# Patient Record
Sex: Male | Born: 1970 | Race: White | Hispanic: No | Marital: Married | Smoking: Never smoker
Health system: Southern US, Community
[De-identification: ages and names within clinical notes are randomized; demographics above are authoritative.]

## PROBLEM LIST (undated history)

## (undated) DIAGNOSIS — K219 Gastro-esophageal reflux disease without esophagitis: Secondary | ICD-10-CM

## (undated) DIAGNOSIS — C801 Malignant (primary) neoplasm, unspecified: Secondary | ICD-10-CM

## (undated) HISTORY — DX: Malignant (primary) neoplasm, unspecified: C80.1

## (undated) HISTORY — DX: Gastro-esophageal reflux disease without esophagitis: K21.9

---
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
## 1988-06-18 HISTORY — PX: OTHER SURGICAL HISTORY: SHX169

## 1988-06-18 HISTORY — PX: ANTERIOR CRUCIATE LIGAMENT REPAIR: SHX115

## 1998-07-21 ENCOUNTER — Ambulatory Visit (HOSPITAL_COMMUNITY): Admission: RE | Admit: 1998-07-21 | Discharge: 1998-07-21 | Payer: Self-pay | Admitting: Family Medicine

## 2000-01-30 ENCOUNTER — Encounter: Payer: Self-pay | Admitting: Orthopedic Surgery

## 2000-01-30 ENCOUNTER — Ambulatory Visit: Admission: RE | Admit: 2000-01-30 | Discharge: 2000-01-30 | Payer: Self-pay | Admitting: Orthopedic Surgery

## 2004-05-16 ENCOUNTER — Ambulatory Visit: Payer: Self-pay | Admitting: Internal Medicine

## 2005-07-09 ENCOUNTER — Ambulatory Visit: Payer: Self-pay | Admitting: Internal Medicine

## 2006-05-28 ENCOUNTER — Ambulatory Visit: Payer: Self-pay | Admitting: Internal Medicine

## 2007-02-24 DIAGNOSIS — K219 Gastro-esophageal reflux disease without esophagitis: Secondary | ICD-10-CM | POA: Insufficient documentation

## 2008-12-10 ENCOUNTER — Ambulatory Visit: Payer: Self-pay | Admitting: Internal Medicine

## 2008-12-13 ENCOUNTER — Telehealth: Payer: Self-pay | Admitting: Internal Medicine

## 2009-03-11 ENCOUNTER — Ambulatory Visit: Payer: Self-pay | Admitting: Internal Medicine

## 2009-03-11 DIAGNOSIS — R109 Unspecified abdominal pain: Secondary | ICD-10-CM | POA: Insufficient documentation

## 2009-03-18 ENCOUNTER — Ambulatory Visit: Payer: Self-pay | Admitting: Sports Medicine

## 2009-03-18 DIAGNOSIS — M7750 Other enthesopathy of unspecified foot: Secondary | ICD-10-CM | POA: Insufficient documentation

## 2009-03-18 DIAGNOSIS — R109 Unspecified abdominal pain: Secondary | ICD-10-CM | POA: Insufficient documentation

## 2009-03-18 DIAGNOSIS — R5383 Other fatigue: Secondary | ICD-10-CM

## 2009-03-18 DIAGNOSIS — R269 Unspecified abnormalities of gait and mobility: Secondary | ICD-10-CM | POA: Insufficient documentation

## 2009-03-18 DIAGNOSIS — R5381 Other malaise: Secondary | ICD-10-CM | POA: Insufficient documentation

## 2009-04-11 ENCOUNTER — Ambulatory Visit: Payer: Self-pay | Admitting: Sports Medicine

## 2009-04-11 DIAGNOSIS — M2140 Flat foot [pes planus] (acquired), unspecified foot: Secondary | ICD-10-CM | POA: Insufficient documentation

## 2009-06-03 ENCOUNTER — Encounter: Payer: Self-pay | Admitting: Internal Medicine

## 2009-11-02 ENCOUNTER — Telehealth: Payer: Self-pay | Admitting: Internal Medicine

## 2010-05-19 ENCOUNTER — Ambulatory Visit: Payer: Self-pay | Admitting: Internal Medicine

## 2010-05-19 DIAGNOSIS — J069 Acute upper respiratory infection, unspecified: Secondary | ICD-10-CM | POA: Insufficient documentation

## 2010-07-16 LAB — CONVERTED CEMR LAB
ALT: 34 units/L (ref 0–53)
AST: 28 units/L (ref 0–37)
Albumin: 4.4 g/dL (ref 3.5–5.2)
Alkaline Phosphatase: 63 units/L (ref 39–117)
BUN: 17 mg/dL (ref 6–23)
Basophils Absolute: 0 10*3/uL (ref 0.0–0.1)
Basophils Relative: 0.2 % (ref 0.0–3.0)
Bilirubin, Direct: 0.2 mg/dL (ref 0.0–0.3)
CO2: 32 meq/L (ref 19–32)
Calcium: 9.5 mg/dL (ref 8.4–10.5)
Chloride: 104 meq/L (ref 96–112)
Cholesterol: 182 mg/dL (ref 0–200)
Creatinine, Ser: 1 mg/dL (ref 0.4–1.5)
Eosinophils Absolute: 0.1 10*3/uL (ref 0.0–0.7)
Eosinophils Relative: 2.3 % (ref 0.0–5.0)
GFR calc non Af Amer: 88.03 mL/min (ref >60)
Glucose, Bld: 108 mg/dL — ABNORMAL HIGH (ref 70–99)
HCT: 46 % (ref 39.0–52.0)
HDL: 43.8 mg/dL (ref >39.00)
Hemoglobin: 16 g/dL (ref 13.0–17.0)
LDL Cholesterol: 119 mg/dL — ABNORMAL HIGH (ref 0–99)
Lymphocytes Relative: 39.5 % (ref 12.0–46.0)
Lymphs Abs: 2.2 10*3/uL (ref 0.7–4.0)
MCHC: 34.7 g/dL (ref 30.0–36.0)
MCV: 89.6 fL (ref 78.0–100.0)
Monocytes Absolute: 0.7 10*3/uL (ref 0.1–1.0)
Monocytes Relative: 12 % (ref 3.0–12.0)
Neutro Abs: 2.6 10*3/uL (ref 1.4–7.7)
Neutrophils Relative %: 46 % (ref 43.0–77.0)
PSA: 0.4 ng/mL (ref 0.10–4.00)
Platelets: 211 10*3/uL (ref 150.0–400.0)
Potassium: 4.2 meq/L (ref 3.5–5.1)
RBC: 5.14 M/uL (ref 4.22–5.81)
RDW: 12 % (ref 11.5–14.6)
Sodium: 142 meq/L (ref 135–145)
TSH: 1.36 microintl units/mL (ref 0.35–5.50)
Total Bilirubin: 1.3 mg/dL — ABNORMAL HIGH (ref 0.3–1.2)
Total CHOL/HDL Ratio: 4
Total Protein: 7.4 g/dL (ref 6.0–8.3)
Triglycerides: 97 mg/dL (ref 0.0–149.0)
VLDL: 19.4 mg/dL (ref 0.0–40.0)
WBC: 5.6 10*3/uL (ref 4.5–10.5)

## 2010-07-20 NOTE — Assessment & Plan Note (Signed)
Summary: NP FOOT PAIN/HIP PAIN/MJD   Vital Signs:  Patient profile:   40 year old male Height:      72 inches Weight:      228 pounds BP sitting:   132 / 74  Vitals Entered By: Lillia Pauls CMA (March 18, 2009 10:04 AM)   History of Present Illness: Patient presents with the concerns for mid-groin pain and left foot pain between the 4th and 5th toes..  1987 had a shattered right patella fixed surgically. Doing well. Then had a left ACL repair in the 1990s.  Has had difficulty with a area of fungal infection/abscess between his 4th and 5th toes on the left since 1989. He is now drying it very well and no long has concerns about this except that he started having some pain near his 4th toe recently. Has seen a podiatrist back in 2000 who thought that he was developing a hammer toe which may need surgery in the future.  Main concern is groin/pubic pain. He has had it intermittently for th past 2 years. Was typically in the right but now has had it in the left since this summer. Usually goes through a 5 day flare in which he did some form of exercise activitiy prior to the onset and then has it flare badly for 2 days and then gradually decrease. Says that the pain is mainly between his scrotum and anus. Has not tried meds or icing. 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  Hurt his back in April while lifting furniture and saw a Chiropodist who worked on his core muscle strength. He did not see a difference with his pubic pain after working on his core muscles. Denies pain with coughing or sneezing. Has been checked by his PCP for hernias which he does not have.   Allergies: No Known Drug Allergies  Physical Exam  General:  alert, well-developed, well-nourished, and well-hydrated.   Head:  normocephalic and atraumatic.   Msk:  RIGHT HIP: Normal inspection. Decreased internal rotation to about 20 degrees. Normal  external rotation. Pain with resisted hip abduction. Weakness with hip abduction against resistance. No pain with resisted hip adduction. Weak hip flexors. Negative pelvic tilt testing.  Negative FABER testing. Normal hamstring strength.   LEFT HIP: Normal inspection. Decreased internal rotation to about 25 degrees. Normal external rotation. Pain again with resisted hip abduction. Weakness of hip abductor and hip flexors. Good hip adductor strength. Negative pelvic tilt and FABER testing. Normal hamstring strength.   Equal leg lengths. No pain over pubic bones.   Pes planus bilaterally with navicular prominance. Pain on plantar surface of left 4th MT with palpation. While jogging: Significant intoeing of the left foot and pronation of the right foot. (Video recorded for records).   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 Will work on correcting his gait to decrease the tension on his pubic joint with line drills and arch strengthing drills. Given a pair of sports insoles to help correct the pronation of his right foot in addition to a metatarsal pad to decrease the pressure on his left 4th MT. Hip exercises. Will consider permanent orthotics in 2-3 weeks.   Problem # 2:  ABNORMALITY OF GAIT (ICD-781.2) Assessment: New  Significant intoeing of left foot and pronation of  right foot while jogging.  Given temporary orthotics to correct pes planus bilaterally. Given a MT pad for metatarsalgia associated with left 4th MT. Lines drills in which he is to put his entire left foot on a line and touch the line with his right big toe to correct his gait.  Will need custom orthotics in the future.   Orders: Sports Insoles [phone])  Problem # 3:  METATARSALGIA (ICD-726.70) Assessment: New  Given a metatarsal pad for his left shoe which had to  be ground down a bit for comfort. Patient liked it before he left the office.   Orders: Sports Insoles [phone])  Problem # 4:  WEAKNESS (ICD-780.79) Assessment: New Hip abductor weakness bilaterally. Given hip strengthening handout with exercises to do daily.   Complete Medication List: 1)  Propecia 1 Mg Tabs (Finasteride) .Marland Kitchen.. 1 once daily as needed  Patient Instructions: 1)  Do line drills - do 50 yards, 10 times. You want your left foot straight along the line and touch your big toe with your right foot to work on your gait. 2)  Do the hip exercises daily. 3)  If you have stairs, walk up and down them 10 times on your toes to work on your arch. 4)  We would like to see you back to make a pair of custom orthotics for you in 2-3 weeks.  5)  Your running gait is what is likely putting tension on your pubic bones causing some shearing forces. 6)  Your orthotics should help with the toe pain. We will try a metatarsal pad under the 4th toe to prevent it from hitting the bottom of your shoe.

## 2010-07-20 NOTE — Assessment & Plan Note (Signed)
Summary: ? groin pain//ccm   Vital Signs:  Patient profile:   40 year old male Weight:      233 pounds BMI:     31.71 Temp:     98.5 degrees F oral BP sitting:   128 / 80  (left arm) Cuff size:   regular  Vitals Entered By: Raechel Ache, RN (March 11, 2009 9:42 AM) CC: C/o bilateral groin pain. Is Patient Diabetic? No   CC:  C/o bilateral groin pain.Marland Kitchen  History of Present Illness: 40 year old patient, who presents with bilateral groin and lower abdominal pain.  This actually has been present intermittently for several months.  He often goes months between episodes.  Pain is maximal left groin area and occasionally involves the right and lower abdominal regions.  Pain is aggravated by movement.  When it is the most severe and resolves usually over 5 to 6 days.  He denies any known aggravating factors.  He was concerned about a possible hernia. he has a history of GERD, which has been stable  Allergies: No Known Drug Allergies  Past History:  Past Medical History: Reviewed history from 02/24/2007 and no changes required. GERD  Physical Exam  General:  overweight-appearing.   Abdomen:  Bowel sounds positive,abdomen soft and non-tender without masses, organomegaly or hernias noted. Genitalia:  Testes bilaterally descended without nodularity, tenderness or masses. No scrotal masses or lesions. No penis lesions or urethral discharge.   Impression & Recommendations:  Problem # 1:  ABDOMINAL PAIN (ICD-789.00) no evidence of hernia.  This appears to be musculoligamentous.  Will treat with ibuprofen during acute flares and try a regimen of stretching and range of motion on a more regular basis.  Will cough are improved  Problem # 2:  GERD (ICD-530.81)  Complete Medication List: 1)  Propecia 1 Mg Tabs (Finasteride) .Marland Kitchen.. 1 once daily as needed  Patient Instructions: 1)  Avoid foods high in acid (tomatoes, citrus juices, spicy foods). Avoid eating within two hours of lying  down or before exercising. Do not over eat; try smaller more frequent meals. Elevate head of bed twelve inches when sleeping. 2)  Take 400-600mg  of Ibuprofen (Advil, Motrin) with food every 4-6 hours as needed for relief of pain or comfort of fever. 3)  consider a regimen of gentle stretching with hip flexion and extension Prescriptions: PROPECIA 1 MG TABS (FINASTERIDE) 1 once daily as needed  #90 x 4   Entered and Authorized by:   Gordy Savers  MD   Signed by:   Gordy Savers  MD on 03/11/2009   Method used:   Print then Give to Patient   RxID:   [id number]

## 2010-07-20 NOTE — Assessment & Plan Note (Signed)
Summary: COUGH, CONGESTION - OK PER DR K // RS   Vital Signs:  Kyle [name] profile:   40 year old male Weight:      247 pounds Temp:     98.1 degrees F oral BP sitting:   110 / 80  (left arm) Cuff size:   regular  Vitals Entered By: Duard Brady LPN (May 19, 2010 12:44 PM) CC: chest congestion, cough Is Kyle [name] Diabetic? No   CC:  chest congestion and cough.  History of Present Illness: 40 year old Kyle [name], who presents with a several day history of chest congestion, head congestion and cough.  There's been no fever.  He has had some intermittent symptoms for 4 to 5 weeks.  He is exposed to a number of small children with acute illness.  He has gastroesophageal  reflux disease, which has been stable.  No real fever, chest pain, shortness or breath or purulent sputum production, and  Allergies (verified): No Known Drug Allergies  Past History:  Past Medical History: Reviewed history from 02/24/2007 and no changes required. GERD  Review of Systems       The Kyle [name] complains of hoarseness and prolonged cough.  The Kyle [name] denies anorexia, fever, weight loss, weight gain, vision loss, decreased hearing, chest pain, syncope, dyspnea on exertion, peripheral edema, headaches, hemoptysis, abdominal pain, melena, hematochezia, severe indigestion/heartburn, hematuria, incontinence, genital sores, muscle weakness, suspicious skin lesions, transient blindness, difficulty walking, depression, unusual weight change, abnormal bleeding, enlarged lymph nodes, angioedema, breast masses, and testicular masses.    Physical Exam  General:  overweight-appearing. normal blood pressure, afebrile. Head:  Normocephalic and atraumatic without obvious abnormalities. No apparent alopecia or balding. Eyes:  No corneal or conjunctival inflammation noted. EOMI. Perrla. Funduscopic exam benign, without hemorrhages, exudates or papilledema. Vision grossly normal. Ears:  External ear exam shows no  significant lesions or deformities.  Otoscopic examination reveals clear canals, tympanic membranes are intact bilaterally without bulging, retraction, inflammation or discharge. Hearing is grossly normal bilaterally. Nose:  External nasal examination shows no deformity or inflammation. Nasal mucosa are pink and moist without lesions or exudates. Mouth:  Oral mucosa and oropharynx without lesions or exudates.  Teeth in good repair. Neck:  No deformities, masses, or tenderness noted. Lungs:  Normal respiratory effort, chest expands symmetrically. Lungs are clear to auscultation, no crackles or wheezes. Heart:  Normal rate and regular rhythm. S1 and S2 normal without gallop, murmur, click, rub or other extra sounds. Abdomen:  Bowel sounds positive,abdomen soft and non-tender without masses, organomegaly or hernias noted.   Impression & Recommendations:  Problem # 1:  URI (ICD-465.9)  His updated medication list for this problem includes:    Hydrocodone-homatropine 5-1.5 Mg/38ml Syrp (Hydrocodone-homatropine) ..... One tsp every 6 hours for cough or congestion a  Complete Medication List: 1)  Propecia 1 Mg Tabs (Finasteride) .Marland Kitchen.. 1 once daily as needed 2)  Hydrocodone-homatropine 5-1.5 Mg/77ml Syrp (Hydrocodone-homatropine) .... One tsp every 6 hours for cough or congestion a  Kyle [name] Instructions: 1)  Get plenty of rest, drink lots of clear liquids, and use Tylenol or Ibuprofen for fever and comfort. Return in 7-10 days if you're not better:sooner if you're feeling worse. Prescriptions: HYDROCODONE-HOMATROPINE 5-1.5 MG/5ML SYRP (HYDROCODONE-HOMATROPINE) one tsp every 6 hours for cough or congestion a  #6 oz x 0   Entered and Authorized by:   Gordy Savers  MD   Signed by:   Gordy Savers  MD on 05/19/2010   Method used:   Print then Give  to Kyle [name]   RxID:   [id number] PROPECIA 1 MG TABS (FINASTERIDE) 1 once daily as needed  #90 x 4   Entered and Authorized by:   Gordy Savers  MD   Signed by:   Gordy Savers  MD on 05/19/2010   Method used:   Electronically to        [facility] [address] [phone]* (retail)       [address]       [address]       [place], [place]  [address]       Ph: [phone]       Fax: [phone]   RxID:   [id number]    Orders Added: 1)  Est. Kyle [name] Level III [56387]

## 2010-07-20 NOTE — Assessment & Plan Note (Signed)
Summary: cpx/njr rsc with pt from [facility] will come fasting/mhf   Vital Signs:  Patient profile:   40 year old male Height:      72 inches Weight:      237 pounds BMI:     32.26 Pulse rate:   68 / minute Pulse rhythm:   regular BP sitting:   114 / 70  (left arm) Cuff size:   regular  Vitals Entered By: Raechel Ache, RN (December 10, 2008 9:18 AM)  CC:  CPX and fasting. Wants PSA.Marland Kitchen  History of Present Illness: 40 year old patient who is seen today for a wellness exam  Allergies (verified): No Known Drug Allergies  Past History:  Past Medical History: Reviewed history from 02/24/2007 and no changes required. GERD  Past Surgical History: surgery for shattered right patella 1986 left ACL 1990  Family History: Reviewed history from 02/24/2007 and no changes required. Family History of Alcoholism/Addiction Family History of Arthritis Family History Diabetes 1st degree relative Family History High cholesterol Family History Hypertension Family History of Stroke F 1st degree relative <60 Family History of Sudden Death Family History of Cardiovascular disorder  father with coronary artery disease, status post CABG, status post carotid endarterectomy, status post ablation for atrial fibrillation; history of type 2 diabetes mother history of hypothyroidism  Social History: Reviewed history from 02/24/2007 and no changes required. Occupation:  Never Smoked Alcohol use-yes Drug use-no Regular exercise-no 3 children Married  Review of Systems  The patient denies anorexia, fever, weight loss, weight gain, vision loss, decreased hearing, hoarseness, chest pain, syncope, dyspnea on exertion, peripheral edema, prolonged cough, headaches, hemoptysis, abdominal pain, melena, hematochezia, severe indigestion/heartburn, hematuria, incontinence, genital sores, muscle weakness, suspicious skin lesions, transient blindness, difficulty walking, depression, unusual weight change,  abnormal bleeding, enlarged lymph nodes, angioedema, breast masses, and testicular masses.    Physical Exam  General:  overweight-appearing.  120/80 Head:  Normocephalic and atraumatic without obvious abnormalities. No apparent alopecia or balding. Eyes:  No corneal or conjunctival inflammation noted. EOMI. Perrla. Funduscopic exam benign, without hemorrhages, exudates or papilledema. Vision grossly normal. Ears:  External ear exam shows no significant lesions or deformities.  Otoscopic examination reveals clear canals, tympanic membranes are intact bilaterally without bulging, retraction, inflammation or discharge. Hearing is grossly normal bilaterally. Nose:  External nasal examination shows no deformity or inflammation. Nasal mucosa are pink and moist without lesions or exudates. Mouth:  Oral mucosa and oropharynx without lesions or exudates.  Teeth in good repair. Neck:  No deformities, masses, or tenderness noted. Chest Wall:  No deformities, masses, tenderness or gynecomastia noted. Breasts:  No masses or gynecomastia noted Lungs:  Normal respiratory effort, chest expands symmetrically. Lungs are clear to auscultation, no crackles or wheezes. Heart:  Normal rate and regular rhythm. S1 and S2 normal without gallop, murmur, click, rub or other extra sounds. Abdomen:  Bowel sounds positive,abdomen soft and non-tender without masses, organomegaly or hernias noted. Rectal:  No external abnormalities noted. Normal sphincter tone. No rectal masses or tenderness. Genitalia:  Testes bilaterally descended without nodularity, tenderness or masses. No scrotal masses or lesions. No penis lesions or urethral discharge. Prostate:  Prostate gland firm and smooth, no enlargement, nodularity, tenderness, mass, asymmetry or induration. Msk:  No deformity or scoliosis noted of thoracic or lumbar spine.   Pulses:  R and L carotid,radial,femoral,dorsalis pedis and posterior tibial pulses are full and equal  bilaterally Extremities:  No clubbing, cyanosis, edema, or deformity noted with normal full range of motion of all  joints.   Neurologic:  No cranial nerve deficits noted. Station and gait are normal. Plantar reflexes are down-going bilaterally. DTRs are symmetrical throughout. Sensory, motor and coordinative functions appear intact. Skin:  Intact without suspicious lesions or rashes Cervical Nodes:  No lymphadenopathy noted Axillary Nodes:  No palpable lymphadenopathy Inguinal Nodes:  No significant adenopathy Psych:  Cognition and judgment appear intact. Alert and cooperative with normal attention span and concentration. No apparent delusions, illusions, hallucinations   Impression & Recommendations:  Problem # 1:  Preventive Health Care (ICD-V70.0)  Orders: Venipuncture (14782) TLB-Lipid Panel (80061-LIPID) TLB-BMP (Basic Metabolic Panel-BMET) (80048-METABOL) TLB-Hepatic/Liver Function Pnl (80076-HEPATIC) TLB-CBC Platelet - w/Differential (85025-CBCD) TLB-TSH (Thyroid Stimulating Hormone) (84443-TSH) TLB-PSA (Prostate Specific Antigen) (84153-PSA)  Complete Medication List: 1)  Propecia 1 Mg Tabs (Finasteride) .Marland Kitchen.. 1 once daily as needed  Patient Instructions: 1)  Limit your Sodium (Salt). 2)  It is important that you exercise regularly at least 20 minutes 5 times a week. If you develop chest pain, have severe difficulty breathing, or feel very tired , stop exercising immediately and seek medical attention. 3)  You need to lose weight. Consider a lower calorie diet and regular exercise.  Prescriptions: PROPECIA 1 MG TABS (FINASTERIDE) 1 once daily as needed  #90 x 4   Entered and Authorized by:   Gordy Savers  MD   Signed by:   Gordy Savers  MD on 12/10/2008   Method used:   Print then Give to Patient   RxID:   [id number]   Appended Document: cpx/njr rsc with pt from [facility] will come fasting/mhf    Clinical Lists Changes  Orders: Added new Service  order of EKG w/ Interpretation (93000) - Signed      Appended Document: cpx/njr rsc with pt from [facility] will come fasting/mhf  Laboratory Results   Urine Tests    Routine Urinalysis   Color: yellow Appearance: Clear Glucose: negative   (Normal Range: Negative) Bilirubin: negative   (Normal Range: Negative) Ketone: negative   (Normal Range: Negative) Spec. Gravity: 1.010   (Normal Range: 1.003-1.035) Blood: negative   (Normal Range: Negative) pH: 6.0   (Normal Range: 5.0-8.0) Protein: negative   (Normal Range: Negative) Urobilinogen: 0.2   (Normal Range: 0-1) Nitrite: negative   (Normal Range: Negative) Leukocyte Esterace: negative   (Normal Range: Negative)    Comments: Joanne Chars CMA  December 10, 2008 4:35 PM

## 2010-07-20 NOTE — Medication Information (Signed)
Summary: [facility]  [facility]   Imported By: Maryln Gottron 06/14/2009 11:41:06  _____________________________________________________________________  External Attachment:    Type:   Image     Comment:   External Document

## 2010-07-20 NOTE — Progress Notes (Signed)
  Phone Note Outgoing Call   Call placed by: Raechel Ache, RN,  December 13, 2008 9:20 AM Call placed to: Patient Summary of Call: [facility] re labs  Follow-up for Phone Call        report given. Follow-up by: Raechel Ache, RN,  December 13, 2008 10:15 AM

## 2010-07-20 NOTE — Assessment & Plan Note (Signed)
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  Seen 1 mo ago and placed in temp orthotics as well as left MT pad.  Notes some improvement in groin and left foot pain - previously having sharp pain along 4th MT, and shooting pain along longitudinal arch when active.  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  Not using any med for pain.  Would like custom orthotics today.  note he had a significant gait abnormality on last visit and this seemed to contribute   Allergies: No Known Drug Allergies  Physical Exam  General:  alert, well-developed, well-nourished, and well-hydrated.   Msk:  Feet: RIGHT: excessive pronation from pes planus.  navicular prominence.  Pain along 4th MT dorsal with palpation.  LEFT: excessive pronation from pes planus.  significant intoeing of foot when walking/jogging. mild TTP at left MT heads primarily 4th  Upon standing loses longitudinal and transverse arches bilaterally.  Equal leg lengths. Extremities:  running gait shows high foot strke ext Rot of RT foot IR of left foot more pronation on rt  after orthotics gait is improved but still with major changes  as above Neurologic:  neurovascularly intact   Impression & Recommendations:  Problem # 1:  ABNORMALITY OF GAIT (ICD-781.2)  fitted with custom orthotics that will hopefully fit in soccer cleats.  try out for comfort, RTC if not fitting (did not bring cleats today).  Also provided with insoles for other shoes for better arch support bilaterally.  Orders: [employer], each unit [phone])  Patient was fitted for a standard, cushioned, semi-rigid orthotic.  The orthotic was heated and the patient stood on the orthotic blank positioned on the  orthotic stand. The patient was positioned in subtalar neutral position and 10 degrees of ankle dorsiflexion in a weight bearing stance. After completion of molding a stable based was applied to the orthotic blank.   The blank was ground to a stable position for weight bearing. size 13 black stripe fast tek base black F4 styrofoam posting  MT pad left additional orthotic padding  time 42 mins  Problem # 2:  METATARSALGIA (ICD-726.70) Assessment: Improved  left MT pad on custom orthotics and soft insoles.  Orders: [employer], each unit (L3002)  Ck back if not responding in 8 wks  Problem # 3:  PES PLANUS (ICD-734)  Orders: [employer], each unit [phone])  given sports insoles to wear into other shoes use arch support as much as possible  Complete Medication List: 1)  Propecia 1 Mg Tabs (Finasteride) .Marland Kitchen.. 1 once daily as needed

## 2010-07-20 NOTE — Progress Notes (Signed)
Summary: finasteride refill  Phone Note Call from Patient Call back at Home Phone [phone]   Summary of Call: Refill Finasteride.  Says the dermatologist gave Rx 5mg  1/4 tab po daily 30 day supply, but he prefers the 1mg  tab daily and needs your Rx to [facility] Initial call taken by: Rudy Jew, RN,  Nov 02, 2009 12:56 PM  Follow-up for Phone Call        OK 1 mg  #90 one daily RF 6 Follow-up by: Gordy Savers  MD,  Nov 03, 2009 8:11 AM    Prescriptions: PROPECIA 1 MG TABS (FINASTERIDE) 1 once daily as needed  #90 x 4   Entered by:   Duard Brady LPN   Authorized by:   Gordy Savers  MD   Signed by:   Duard Brady LPN on 09/81/1914   Method used:   Faxed to ...       [facility] [address] [phone]* (retail)       [address]       [address]       [place], [place]  [address]       Ph: [phone]       Fax: [phone]   RxID:   [id number]  faxed to [facility]    kik

## 2011-04-16 ENCOUNTER — Ambulatory Visit (INDEPENDENT_AMBULATORY_CARE_PROVIDER_SITE_OTHER): Payer: BC Managed Care – PPO | Admitting: Family Medicine

## 2011-04-16 ENCOUNTER — Encounter: Payer: Self-pay | Admitting: Family Medicine

## 2011-04-16 VITALS — BP 126/80 | HR 84 | Temp 99.0°F | Wt 238.0 lb

## 2011-04-16 DIAGNOSIS — J329 Chronic sinusitis, unspecified: Secondary | ICD-10-CM

## 2011-04-16 MED ORDER — AMOXICILLIN-POT CLAVULANATE 875-125 MG PO TABS: 1.0000 | ORAL_TABLET | Freq: Two times a day (BID) | ORAL | Status: AC

## 2011-04-16 NOTE — Progress Notes (Signed)
  Subjective:    Patient ID: OMEED OSUNA, male    DOB: 1971/04/29, 40 y.o.   MRN: [id number]  HPI Here for 6 days of sinus pressure, PND, ST, and a dry cough. Low grade fevers.    Review of Systems  Constitutional: Positive for fever.  HENT: Positive for congestion, postnasal drip and sinus pressure.   Eyes: Negative.   Respiratory: Positive for cough.        Objective:   Physical Exam  Constitutional: He appears well-developed and well-nourished.  HENT:  Right Ear: External ear normal.  Left Ear: External ear normal.  Nose: Nose normal.  Mouth/Throat: Oropharynx is clear and moist. No oropharyngeal exudate.  Eyes: Conjunctivae are normal. Pupils are equal, round, and reactive to light.  Neck: No thyromegaly present.  Pulmonary/Chest: Effort normal and breath sounds normal. No respiratory distress. He has no wheezes. He has no rales. He exhibits no tenderness.  Lymphadenopathy:    He has no cervical adenopathy.          Assessment & Plan:  Add Mucinex

## 2013-06-18 DIAGNOSIS — C099 Malignant neoplasm of tonsil, unspecified: Secondary | ICD-10-CM

## 2013-06-18 HISTORY — DX: Malignant neoplasm of tonsil, unspecified: C09.9

## 2013-09-29 ENCOUNTER — Ambulatory Visit (INDEPENDENT_AMBULATORY_CARE_PROVIDER_SITE_OTHER): Payer: BC Managed Care – PPO | Admitting: Family Medicine

## 2013-09-29 ENCOUNTER — Encounter: Payer: Self-pay | Admitting: Family Medicine

## 2013-09-29 VITALS — BP 110/74 | HR 84 | Temp 99.7°F | Wt 246.0 lb

## 2013-09-29 DIAGNOSIS — J069 Acute upper respiratory infection, unspecified: Secondary | ICD-10-CM

## 2013-09-29 NOTE — Progress Notes (Signed)
Pre visit review using our clinic review tool, if applicable. No additional management support is needed unless otherwise documented below in the visit note. 

## 2013-09-29 NOTE — Progress Notes (Signed)
Chief Complaint  Patient presents with  . Cough    congestion, fever    HPI:  -started: about  4 days ago -symptoms:nasal congestion, low grade subjective fever, cough -denies:fever, SOB, NVD, tooth pain -has tried: nothing -sick contacts/travel/risks: denies flu exposure, tick exposure or or Ebola risks - daughter with similar symptoms then she impoved  ROS: See pertinent positives and negatives per HPI.  Past Medical History  Diagnosis Date  . GERD (gastroesophageal reflux disease)     Past Surgical History  Procedure Laterality Date  . Surgery for shattered right patella  1986  . Left acl  1990    Family History  Problem Relation Age of Onset  . Hypothyroidism Mother   . Coronary artery disease Father     status post CABG, carotid endarterectomy, ablation for atrial fib  . Diabetes Father   . Atrial fibrillation Father   . Alcohol abuse Other   . Arthritis Other   . Diabetes Other   . Hyperlipidemia Other   . Hypertension Other   . Stroke Other   . Sudden death Other   . Heart disease Other     History   Social History  . Marital Status: Married    Spouse Name: N/A    Number of Children: N/A  . Years of Education: N/A   Social History Main Topics  . Smoking status: Never Smoker   . Smokeless tobacco: Never Used  . Alcohol Use: 1.5 oz/week    3 drink(s) per week  . Drug Use: No  . Sexual Activity: None   Other Topics Concern  . None   Social History Narrative  . None    Current outpatient prescriptions:finasteride (PROPECIA) 1 MG tablet, Take 1 mg by mouth daily.  , Disp: , Rfl: ;  HYDROcodone-homatropine (HYCODAN) 5-1.5 MG/5ML syrup, Take by mouth every 6 (six) hours as needed.  , Disp: , Rfl:   EXAM:  Filed Vitals:   09/29/13 1449  BP: 110/74  Pulse: 84  Temp: 99.7 F (37.6 C)    Body mass index is 33.36 kg/(m^2).  GENERAL: vitals reviewed and listed above, alert, oriented, appears well hydrated and in no acute distress  HEENT:  atraumatic, conjunttiva clear, no obvious abnormalities on inspection of external nose and ears, normal appearance of ear canals and TMs, clear nasal congestion, mild post oropharyngeal erythema with PND, no tonsillar edema or exudate, no sinus TTP  NECK: no obvious masses on inspection  LUNGS: clear to auscultation bilaterally, no wheezes, rales or rhonchi, good air movement  CV: HRRR, no peripheral edema  MS: moves all extremities without noticeable abnormality  PSYCH: pleasant and cooperative, no obvious depression or anxiety  ASSESSMENT AND PLAN:  Discussed the following assessment and plan:  Upper respiratory infection  -given HPI and exam findings today, a serious infection or illness is unlikely. We discussed potential etiologies, with VURI being most likely, and advised supportive care and monitoring. We discussed treatment side effects, likely course, antibiotic misuse, transmission, and signs of developing a serious illness. -of course, we advised to return or notify a doctor immediately if symptoms worsen or persist or new concerns arise.    Patient Instructions  INSTRUCTIONS FOR UPPER RESPIRATORY INFECTION:  -plenty of rest and fluids  -nasal saline wash 2-3 times daily (use prepackaged nasal saline or bottled/distilled water if making your own)   -clean nose with nasal saline before using the nasal steroid or sinex  -can use AFRIN nasal spray for drainage and  nasal congestion - but do NOT use longer then 3-4 days  -can use tylenol or ibuprofen as directed for aches and sorethroat  -in the winter time, using a humidifier at night is helpful (please follow cleaning instructions)  -if you are taking a cough medication - use only as directed, may also try a teaspoon of honey to coat the throat and throat lozenges  -for sore throat, salt water gargles can help  -follow up if you have fevers, facial pain, tooth pain, difficulty breathing or are worsening or not  getting better in 5-7 days      Lucretia Kern

## 2013-09-29 NOTE — Patient Instructions (Signed)
INSTRUCTIONS FOR UPPER RESPIRATORY INFECTION:  -plenty of rest and fluids  -nasal saline wash 2-3 times daily (use prepackaged nasal saline or bottled/distilled water if making your own)   -clean nose with nasal saline before using the nasal steroid or sinex  -can use AFRIN nasal spray for drainage and nasal congestion - but do NOT use longer then 3-4 days  -can use tylenol or ibuprofen as directed for aches and sorethroat  -in the winter time, using a humidifier at night is helpful (please follow cleaning instructions)  -if you are taking a cough medication - use only as directed, may also try a teaspoon of honey to coat the throat and throat lozenges  -for sore throat, salt water gargles can help  -follow up if you have fevers, facial pain, tooth pain, difficulty breathing or are worsening or not getting better in 5-7 days  

## 2013-12-07 ENCOUNTER — Encounter: Payer: Self-pay | Admitting: Internal Medicine

## 2013-12-07 ENCOUNTER — Ambulatory Visit (INDEPENDENT_AMBULATORY_CARE_PROVIDER_SITE_OTHER): Payer: BC Managed Care – PPO | Admitting: Internal Medicine

## 2013-12-07 ENCOUNTER — Ambulatory Visit: Payer: BC Managed Care – PPO | Admitting: Family

## 2013-12-07 VITALS — BP 120/66 | HR 85 | Temp 99.4°F | Resp 20 | Ht 72.0 in | Wt 240.0 lb

## 2013-12-07 DIAGNOSIS — J209 Acute bronchitis, unspecified: Secondary | ICD-10-CM

## 2013-12-07 DIAGNOSIS — J069 Acute upper respiratory infection, unspecified: Secondary | ICD-10-CM

## 2013-12-07 MED ORDER — HYDROCODONE-HOMATROPINE 5-1.5 MG/5ML PO SYRP: 5.0000 mL | ORAL_SOLUTION | Freq: Four times a day (QID) | ORAL | Status: DC | PRN

## 2013-12-07 MED ORDER — AZITHROMYCIN 250 MG PO TABS: ORAL_TABLET | ORAL | Status: DC

## 2013-12-07 NOTE — Progress Notes (Signed)
Pre-visit discussion using our clinic review tool. No additional management support is needed unless otherwise documented below in the visit note.  

## 2013-12-07 NOTE — Patient Instructions (Signed)
Take over-the-counter expectorants and cough medications such as  Mucinex DM.  Call if there is no improvement in 5 to 7 days or if  you develop worsening cough, fever, or new symptoms, such as shortness of breath or chest pain.  Take your antibiotic as prescribed until ALL of it is gone, but stop if you develop a rash, swelling, or any side effects of the medication.  Contact our office as soon as possible if  there are side effects of the medication. 

## 2013-12-07 NOTE — Progress Notes (Signed)
Subjective:    Patient ID: Kyle [name], male    DOB: 10/16/1970, 43 y.o.   MRN: [id number]  HPI  43 year old patient, who presents with a 2 and a half-to three-month history of cough.  He was seen here about 9 weeks ago and treated symptomatically for a viral URI.  Cough has persisted, but over the past week, and a half.  He has clinically worsened with low-grade fever, malaise, and now with purulent sputum production.  He describes a considerable amount of green sputum.  He expectorates in the morning.  Denies any wheezing, shortness of breath, or chest pain.  Past Medical History  Diagnosis Date  . GERD (gastroesophageal reflux disease)     History   Social History  . Marital Status: Married    Spouse Name: N/A    Number of Children: N/A  . Years of Education: N/A   Occupational History  . Not on file.   Social History Main Topics  . Smoking status: Never Smoker   . Smokeless tobacco: Never Used  . Alcohol Use: 1.5 oz/week    3 drink(s) per week  . Drug Use: No  . Sexual Activity: Not on file   Other Topics Concern  . Not on file   Social History Narrative  . No narrative on file    Past Surgical History  Procedure Laterality Date  . Surgery for shattered right patella  1986  . Left acl  1990    Family History  Problem Relation Age of Onset  . Hypothyroidism Mother   . Coronary artery disease Father     status post CABG, carotid endarterectomy, ablation for atrial fib  . Diabetes Father   . Atrial fibrillation Father   . Alcohol abuse Other   . Arthritis Other   . Diabetes Other   . Hyperlipidemia Other   . Hypertension Other   . Stroke Other   . Sudden death Other   . Heart disease Other     No Known Allergies  Current Outpatient Prescriptions on File Prior to Visit  Medication Sig Dispense Refill  . HYDROcodone-homatropine (HYCODAN) 5-1.5 MG/5ML syrup Take by mouth every 6 (six) hours as needed.         No current facility-administered  medications on file prior to visit.    BP 120/66  Pulse 85  Temp(Src) 99.4 F (37.4 C) (Oral)  Resp 20  Ht 6' (1.829 m)  Wt 240 lb (108.863 kg)  BMI 32.54 kg/m2  SpO2 96%       Review of Systems  Constitutional: Positive for fever and fatigue. Negative for chills and appetite change.  HENT: Negative for congestion, dental problem, ear pain, hearing loss, rhinorrhea, sore throat, tinnitus, trouble swallowing and voice change.   Eyes: Negative for pain, discharge and visual disturbance.  Respiratory: Positive for cough. Negative for chest tightness, wheezing and stridor.   Cardiovascular: Negative for chest pain, palpitations and leg swelling.  Gastrointestinal: Negative for nausea, vomiting, abdominal pain, diarrhea, constipation, blood in stool and abdominal distention.  Genitourinary: Negative for urgency, hematuria, flank pain, discharge, difficulty urinating and genital sores.  Musculoskeletal: Negative for arthralgias, back pain, gait problem, joint swelling, myalgias and neck stiffness.  Skin: Negative for rash.  Neurological: Negative for dizziness, syncope, speech difficulty, weakness, numbness and headaches.  Hematological: Negative for adenopathy. Does not bruise/bleed easily.  Psychiatric/Behavioral: Negative for behavioral problems and dysphoric mood. The patient is not nervous/anxious.        Objective:  Physical Exam  Constitutional: He is oriented to person, place, and time. He appears well-developed.  HENT:  Head: Normocephalic.  Right Ear: External ear normal.  Left Ear: External ear normal.  Oropharynx mildly injected Right TM not well visualized secondary to cerumen  Eyes: Conjunctivae and EOM are normal.  Neck: Normal range of motion.  Cardiovascular: Normal rate and normal heart sounds.   Pulmonary/Chest: Effort normal and breath sounds normal. No respiratory distress. He has no wheezes. He has no rales.  Abdominal: Bowel sounds are normal.    Musculoskeletal: Normal range of motion. He exhibits no edema and no tenderness.  Neurological: He is alert and oriented to person, place, and time.  Psychiatric: He has a normal mood and affect. His behavior is normal.          Assessment & Plan:   Bronchitis.  Patient has had cough for at least 10 weeks and more recently, with fever, malaise, and purulent sputum production.  Will treat with azithromycin and antitussives

## 2013-12-21 ENCOUNTER — Ambulatory Visit (INDEPENDENT_AMBULATORY_CARE_PROVIDER_SITE_OTHER): Payer: BC Managed Care – PPO | Admitting: Internal Medicine

## 2013-12-21 ENCOUNTER — Encounter: Payer: Self-pay | Admitting: Internal Medicine

## 2013-12-21 ENCOUNTER — Ambulatory Visit (INDEPENDENT_AMBULATORY_CARE_PROVIDER_SITE_OTHER)
Admission: RE | Admit: 2013-12-21 | Discharge: 2013-12-21 | Disposition: A | Discharge status: Home / Self Care | Payer: BC Managed Care – PPO | Source: Ambulatory Visit | Attending: Internal Medicine | Admitting: Internal Medicine

## 2013-12-21 VITALS — BP 126/88 | HR 71 | Temp 98.4°F | Resp 20 | Ht 70.75 in | Wt 241.0 lb

## 2013-12-21 DIAGNOSIS — R053 Chronic cough: Secondary | ICD-10-CM

## 2013-12-21 DIAGNOSIS — Z0000 Encounter for general adult medical examination without abnormal findings: Secondary | ICD-10-CM

## 2013-12-21 DIAGNOSIS — R05 Cough: Secondary | ICD-10-CM

## 2013-12-21 DIAGNOSIS — R059 Cough, unspecified: Secondary | ICD-10-CM

## 2013-12-21 LAB — LIPID PANEL
Cholesterol: 205 mg/dL — ABNORMAL HIGH (ref 0–200)
HDL: 50 mg/dL (ref >39.00)
LDL Cholesterol: 136 mg/dL — ABNORMAL HIGH (ref 0–99)
NonHDL: 155
Total CHOL/HDL Ratio: 4
Triglycerides: 95 mg/dL (ref 0.0–149.0)
VLDL: 19 mg/dL (ref 0.0–40.0)

## 2013-12-21 LAB — CBC WITH DIFFERENTIAL/PLATELET
Basophils Absolute: 0 10*3/uL (ref 0.0–0.1)
Basophils Relative: 0.6 % (ref 0.0–3.0)
Eosinophils Absolute: 0.2 10*3/uL (ref 0.0–0.7)
Eosinophils Relative: 3.8 % (ref 0.0–5.0)
HCT: 44.1 % (ref 39.0–52.0)
Hemoglobin: 14.8 g/dL (ref 13.0–17.0)
Lymphocytes Relative: 34.3 % (ref 12.0–46.0)
Lymphs Abs: 2.1 10*3/uL (ref 0.7–4.0)
MCHC: 33.6 g/dL (ref 30.0–36.0)
MCV: 89.4 fl (ref 78.0–100.0)
Monocytes Absolute: 0.8 10*3/uL (ref 0.1–1.0)
Monocytes Relative: 13 % — ABNORMAL HIGH (ref 3.0–12.0)
Neutro Abs: 3 10*3/uL (ref 1.4–7.7)
Neutrophils Relative %: 48.3 % (ref 43.0–77.0)
Platelets: 232 10*3/uL (ref 150.0–400.0)
RBC: 4.94 Mil/uL (ref 4.22–5.81)
RDW: 12.7 % (ref 11.5–15.5)
WBC: 6.1 10*3/uL (ref 4.0–10.5)

## 2013-12-21 LAB — COMPREHENSIVE METABOLIC PANEL
ALT: 33 U/L (ref 0–53)
AST: 26 U/L (ref 0–37)
Albumin: 4.2 g/dL (ref 3.5–5.2)
Alkaline Phosphatase: 65 U/L (ref 39–117)
BUN: 17 mg/dL (ref 6–23)
CO2: 29 mEq/L (ref 19–32)
Calcium: 9.4 mg/dL (ref 8.4–10.5)
Chloride: 103 mEq/L (ref 96–112)
Creatinine, Ser: 1 mg/dL (ref 0.4–1.5)
GFR: 87.96 mL/min (ref >60.00)
Glucose, Bld: 109 mg/dL — ABNORMAL HIGH (ref 70–99)
Potassium: 3.9 mEq/L (ref 3.5–5.1)
Sodium: 139 mEq/L (ref 135–145)
Total Bilirubin: 1.1 mg/dL (ref 0.2–1.2)
Total Protein: 7.4 g/dL (ref 6.0–8.3)

## 2013-12-21 LAB — TSH: TSH: 1.75 u[IU]/mL (ref 0.35–4.50)

## 2013-12-21 NOTE — Progress Notes (Signed)
Pre visit review using our clinic review tool, if applicable. No additional management support is needed unless otherwise documented below in the visit note. 

## 2013-12-21 NOTE — Patient Instructions (Signed)
It is important that you exercise regularly, at least 20 minutes 3 to 4 times per week.  If you develop chest pain or shortness of breath seek  medical attention.  You need to lose weight.  Consider a lower calorie diet and regular exercise.  Chest x-ray as discussed  Pulmicort 2 puffs twice daily

## 2013-12-21 NOTE — Progress Notes (Signed)
Subjective:    Patient ID: Kyle [name], male    DOB: 1971/05/19, 43 y.o.   MRN: [id number]  HPI  43 year old patient who is seen today for a preventive health examination. He has been seen recently and treated for a bronchitis.  He now has cough of 3 months duration.  After treatment with erythromycin.  His green sputum production cleared, but now has returned with the discolored sputum production.  He feels a bit unwell, but no fever or chills, or major constitutional complaints.  He does note some occasional mild wheezing.  3 children have had some mild childhood asthma  Allergies (verified):  No Known Drug Allergies   Past History:  GERD  Past Surgical History:  surgery for shattered right patella 1986  left ACL 1990   Family History:    Family History of Alcoholism/Addiction  Family History of Arthritis  Family History Diabetes 1st degree relative  Family History High cholesterol  Family History Hypertension  Family History of Stroke F 1st degree relative <60  Family History of Sudden Death  Family History of Cardiovascular disorder   father with coronary artery disease, status post CABG, status post carotid endarterectomy, status post ablation for atrial fibrillation; history of type 2 diabetes  mother history of hypothyroidism   Social History:   Occupation:  Never Smoked  Alcohol use-yes  Drug use-no  Regular exercise-no  3 children  Married    Review of Systems  Constitutional: Positive for fatigue. Negative for fever, chills, activity change and appetite change.  HENT: Negative for congestion, dental problem, ear pain, hearing loss, mouth sores, rhinorrhea, sinus pressure, sneezing, tinnitus, trouble swallowing and voice change.   Eyes: Negative for photophobia, pain, redness and visual disturbance.  Respiratory: Positive for cough. Negative for apnea, choking, chest tightness, shortness of breath and wheezing.   Cardiovascular: Negative for chest  pain, palpitations and leg swelling.  Gastrointestinal: Negative for nausea, vomiting, abdominal pain, diarrhea, constipation, blood in stool, abdominal distention, anal bleeding and rectal pain.  Genitourinary: Negative for dysuria, urgency, frequency, hematuria, flank pain, decreased urine volume, discharge, penile swelling, scrotal swelling, difficulty urinating, genital sores and testicular pain.  Musculoskeletal: Negative for arthralgias, back pain, gait problem, joint swelling, myalgias, neck pain and neck stiffness.  Skin: Negative for color change, rash and wound.  Neurological: Negative for dizziness, tremors, seizures, syncope, facial asymmetry, speech difficulty, weakness, light-headedness, numbness and headaches.  Hematological: Negative for adenopathy. Does not bruise/bleed easily.  Psychiatric/Behavioral: Negative for suicidal ideas, hallucinations, behavioral problems, confusion, sleep disturbance, self-injury, dysphoric mood, decreased concentration and agitation. The patient is not nervous/anxious.        Objective:   Physical Exam  Constitutional: He appears well-developed and well-nourished.  HENT:  Head: Normocephalic and atraumatic.  Right Ear: External ear normal.  Left Ear: External ear normal.  Nose: Nose normal.  Mouth/Throat: Oropharynx is clear and moist.  Eyes: Conjunctivae and EOM are normal. Pupils are equal, round, and reactive to light. No scleral icterus.  Neck: Normal range of motion. Neck supple. No JVD present. No thyromegaly present.  Cardiovascular: Regular rhythm, normal heart sounds and intact distal pulses.  Exam reveals no gallop and no friction rub.   No murmur heard. Pulmonary/Chest: Effort normal and breath sounds normal. No respiratory distress. He has no wheezes. He has no rales. He exhibits no tenderness.  Abdominal: Soft. Bowel sounds are normal. He exhibits no distension and no mass. There is no tenderness.  Genitourinary: Penis normal.  Musculoskeletal: Normal range of motion. He exhibits no edema and no tenderness.  Lymphadenopathy:    He has no cervical adenopathy.  Neurological: He is alert. He has normal reflexes. No cranial nerve deficit. Coordination normal.  Skin: Skin is warm and dry. No rash noted.  Psychiatric: He has a normal mood and affect. His behavior is normal.          Assessment & Plan:   Preventive health examination Chronic cough.  We'll check a chest x-ray, as well as screening lab Obesity.  Weight loss encouraged  We'll give a trial of Pulmicort 2 puffs twice daily

## 2013-12-22 ENCOUNTER — Encounter: Payer: Self-pay | Admitting: Internal Medicine

## 2013-12-23 ENCOUNTER — Encounter: Payer: Self-pay | Admitting: Internal Medicine

## 2013-12-28 NOTE — Telephone Encounter (Signed)
Dr. Raliegh Ip, okay to release pt's lab results?

## 2014-01-02 ENCOUNTER — Encounter: Payer: Self-pay | Admitting: Internal Medicine

## 2014-03-01 ENCOUNTER — Ambulatory Visit (INDEPENDENT_AMBULATORY_CARE_PROVIDER_SITE_OTHER): Payer: BC Managed Care – PPO | Admitting: Internal Medicine

## 2014-03-01 ENCOUNTER — Encounter: Payer: Self-pay | Admitting: Internal Medicine

## 2014-03-01 VITALS — BP 140/80 | HR 73 | Temp 99.0°F | Resp 20 | Ht 70.75 in | Wt 241.0 lb

## 2014-03-01 DIAGNOSIS — K1120 Sialoadenitis, unspecified: Secondary | ICD-10-CM

## 2014-03-01 DIAGNOSIS — J309 Allergic rhinitis, unspecified: Secondary | ICD-10-CM

## 2014-03-01 NOTE — Patient Instructions (Addendum)
Call for ENT referral if there is not prompt clinical resolutionAllergic Rhinitis Allergic rhinitis is when the mucous membranes in the nose respond to allergens. Allergens are particles in the air that cause your body to have an allergic reaction. This causes you to release allergic antibodies. Through a chain of events, these eventually cause you to release histamine into the blood stream. Although meant to protect the body, it is this release of histamine that causes your discomfort, such as frequent sneezing, congestion, and an itchy, runny nose.  CAUSES  Seasonal allergic rhinitis (hay fever) is caused by pollen allergens that may come from grasses, trees, and weeds. Year-round allergic rhinitis (perennial allergic rhinitis) is caused by allergens such as house dust mites, pet dander, and mold spores.  SYMPTOMS   Nasal stuffiness (congestion).  Itchy, runny nose with sneezing and tearing of the eyes. DIAGNOSIS  Your health care provider can help you determine the allergen or allergens that trigger your symptoms. If you and your health care provider are unable to determine the allergen, skin or blood testing may be used. TREATMENT  Allergic rhinitis does not have a cure, but it can be controlled by:  Medicines and allergy shots (immunotherapy).  Avoiding the allergen. Hay fever may often be treated with antihistamines in pill or nasal spray forms. Antihistamines block the effects of histamine. There are over-the-counter medicines that may help with nasal congestion and swelling around the eyes. Check with your health care provider before taking or giving this medicine.  If avoiding the allergen or the medicine prescribed do not work, there are many new medicines your health care provider can prescribe. Stronger medicine may be used if initial measures are ineffective. Desensitizing injections can be used if medicine and avoidance does not work. Desensitization is when a patient is given ongoing  shots until the body becomes less sensitive to the allergen. Make sure you follow up with your health care provider if problems continue. HOME CARE INSTRUCTIONS It is not possible to completely avoid allergens, but you can reduce your symptoms by taking steps to limit your exposure to them. It helps to know exactly what you are allergic to so that you can avoid your specific triggers. SEEK MEDICAL CARE IF:   You have a fever.  You develop a cough that does not stop easily (persistent).  You have shortness of breath.  You start wheezing.  Symptoms interfere with normal daily activities. Document Released: 02/27/2001 Document Revised: 06/09/2013 Document Reviewed: 02/09/2013 [facility] Patient Information 2015 [place], [place]. This information is not intended to replace advice given to you by your health care provider. Make sure you discuss any questions you have with your health care provider. Sialadenitis Sialadenitis is an inflammation (soreness) of the salivary glands. The parotid is the main salivary gland. It lies behind the angle of the jaw below the ear. The saliva produced comes out of a tiny opening (duct) inside the cheek on either side. This is usually at the level of the upper back teeth. If it is swollen, the ear is pushed up and out. This helps tell this condition apart from a simple lymph gland infection (swollen glands) in the same area. Mumps has mostly disappeared since the start of immunization against mumps. Now the most common cause of parotitis is germ (bacterial) infection or inflammation of the lymphatics (the lymph channels). The other major salivary gland is located in the floor of the mouth. Smaller salivary glands are located in the mouth. This includes the:  Lips.  Lining of the mouth.  Pharynx.  Hard palate (front part of the roof of the mouth). The salivary glands do many things, including:  Lubrication.  Breaking down food.  Production of hormones and  antibodies (to protect against germs which may cause illness).  Help with the sense of taste. ACUTE BACTERIAL SIALADENITIS This is a sudden inflammatory response to bacterial infection. This causes redness, pain, swelling and tenderness over the infected gland. In the past, it was common in dehydrated and debilitated patients often following an operation. It is now more commonly seen:  After radiotherapy.  In patients with poor immune systems. Treatment is:  The correction of fluid balance (rehydration).  Medicine that kill germs (antibiotics).  Pain relief. CHRONIC RECURRENT SIALADENITIS This refers to repeated episodes of discomfort and swelling of one of the salivary glands. It often occurs after eating. Chronic sialadenitis is usually less painful. It is associated with recurrent enlargement of a salivary gland, often following meals, and typically with an absence of redness. The chronic form of the disease often is associated with conditions linked to decreased salivary flow, rather than dehydration (loss of body fluids). These conditions include:  A stone, or concretion, formed in the gallbladder, kidneys, or other parts of the body (calculi).  Salivary stasis.  A change in the fluid and electrolyte (the salts in your body fluids) makeup of the gland. It is treated with:  Gland massage.  Methods to stimulate the flow of saliva, (for example, lemon juice).  Antibiotics if required. Surgery to remove the gland is possible, but its benefits need to be balanced against risks.  VIRAL SIALADENITIS Several viruses infect the salivary glands. Some of these include the mumps virus that commonly infects the parotid gland. Other viruses causing problems are:  The HIV virus.  Herpes.  Some of the influenza ("flu") viruses. RECURRENT SIALADENITIS IN CHILDREN This condition is thought to be due to swelling or ballooning of the ducts. It results in the same symptoms as acute bacterial  parotitis. It is usually caused by germs (bacteria). It is often treated using penicillin. It may get well without treatment. Surgery is usually not required. TUBERCULOUS SIALADENITIS The salivary glands may become infected with the same bacteria causing tuberculosis ("TB"). Treatment is with anti-tuberculous antibiotic therapy. OTHER UNCOMMON CAUSES OF SIALADENITIS   Sjogren's syndrome is a condition in which arthritis is associated with a decrease in activity of the glands of the body that produce saliva and tears. The diagnosis is made with blood tests or by examination of a piece of tissue from the inside of the lip. Some people with this condition are bothered by:  A dry mouth.  Intermittent salivary gland enlargement.  Atypical mycobacteria is a germ similar to tuberculosis. It often infects children. It is often resistant to antibiotic treatment. It may require surgical treatment to remove the infected salivary gland.  Actinomycosis is an infection of the parotid gland that may also involve the overlying skin. The diagnosis is made by detecting granules of sulphur produced by the bacteria on microscopic examination. Treatment is a prolonged course of penicillin for up to one year.  Nutritional causes include vitamin deficiencies and bulimia.  Diabetes and problems with your thyroid.  Obesity, cirrhosis, and malabsorption are some metabolic causes. HOME CARE INSTRUCTIONS   Apply ice bags every 2 hours for 15-20 minutes, while awake, to the sore gland for 24 hours, then as directed by your caregiver. Place the ice in a plastic bag with a  towel around it to prevent frostbite to the skin.  Only take over-the-counter or prescription medicines for pain, discomfort, or fever as directed by your caregiver. SEEK IMMEDIATE MEDICAL CARE IF:   There is increased pain or swelling in your gland that is not controlled with medicine.  An oral temperature above 102 F (38.9 C) develops, not  controlled by medicine.  You develop difficulty opening your mouth, swallowing, or speaking. Document Released: 11/24/2001 Document Revised: 08/27/2011 Document Reviewed: 01/19/2008 [facility] Patient Information 2015 [place], [place]. This information is not intended to replace advice given to you by your health care provider. Make sure you discuss any questions you have with your health care provider. Salivary Stone Your exam shows you have a stone in one of your saliva glands. These small stones form around a mucous plug in the ducts of the glands and cause the saliva in the gland to be blocked. This makes the gland swollen and painful, especially when you eat. If repeated episodes occur, the gland can become infected. Sometimes these stones can be seen on x-ray. Treatment includes stimulating the production of saliva to push the stone out. You should suck on a lemon or sour candies several times daily. Antibiotic medicine may be needed if the gland is infected. Increasing fluids, applying warm compresses to the swollen area 3-4 times daily, and massaging the gland from back to front may encourage drainage and passage of the stone. Surgical treatment to remove the stone is sometimes necessary, so proper medical follow up is very important. Call your doctor for an appointment as recommended. Call right away if you have a high fever, severe headache, vomiting, uncontrolled pain, or other serious symptoms. Document Released: 07/12/2004 Document Revised: 08/27/2011 Document Reviewed: 06/04/2005 [facility] Patient Information 2015 [place], [place]. This information is not intended to replace advice given to you by your health care provider. Make sure you discuss any questions you have with your health care provider.

## 2014-03-01 NOTE — Progress Notes (Signed)
Subjective:    Patient ID: [name], male    DOB: 11-16-1970, 43 y.o.   MRN: [id number]  HPI 43 year old patient who was seen approximately 2 months ago for a preventive health examination.  At that time.  Complaints include a chronic cough.  He was self-referred to a [facility] pulmonary medicine, who performed normal spirometry evaluation.  He was given a prescription for Flonase, which she has taken inconsistently.  More recently has developed some sinus congestion, drainage, and general sense of unwellness.  His chief complaint is a left neck mass first noted yesterday.  This is nontender. He feels the left neck mass has been there only a very brief period of time since it is quite obvious with his shaving  Past Medical History  Diagnosis Date  . GERD (gastroesophageal reflux disease)     History   Social History  . Marital Status: Married    Spouse Name: N/A    Number of Children: N/A  . Years of Education: N/A   Occupational History  . Not on file.   Social History Main Topics  . Smoking status: Never Smoker   . Smokeless tobacco: Never Used  . Alcohol Use: 1.5 oz/week    3 drink(s) per week  . Drug Use: No  . Sexual Activity: Not on file   Other Topics Concern  . Not on file   Social History Narrative  . No narrative on file    Past Surgical History  Procedure Laterality Date  . Surgery for shattered right patella  1986  . Left acl  1990    Family History  Problem Relation Age of Onset  . Hypothyroidism Mother   . Coronary artery disease Father     status post CABG, carotid endarterectomy, ablation for atrial fib  . Diabetes Father   . Atrial fibrillation Father   . Alcohol abuse Other   . Arthritis Other   . Diabetes Other   . Hyperlipidemia Other   . Hypertension Other   . Stroke Other   . Sudden death Other   . Heart disease Other     No Known Allergies  No current outpatient prescriptions on file prior to visit.   No current  facility-administered medications on file prior to visit.    BP 140/80  Pulse 73  Temp(Src) 99 F (37.2 C) (Oral)  Resp 20  Ht 5' 10.75" (1.797 m)  Wt 241 lb (109.317 kg)  BMI 33.85 kg/m2  SpO2 97%     Review of Systems  Constitutional: Positive for fatigue. Negative for fever, chills and appetite change.  HENT: Positive for postnasal drip, rhinorrhea and sinus pressure. Negative for congestion, dental problem, ear pain, hearing loss, sore throat, tinnitus, trouble swallowing and voice change.   Eyes: Negative for pain, discharge and visual disturbance.  Respiratory: Negative for cough, chest tightness, wheezing and stridor.   Cardiovascular: Negative for chest pain, palpitations and leg swelling.  Gastrointestinal: Negative for nausea, vomiting, abdominal pain, diarrhea, constipation, blood in stool and abdominal distention.  Genitourinary: Negative for urgency, hematuria, flank pain, discharge, difficulty urinating and genital sores.  Musculoskeletal: Negative for arthralgias, back pain, gait problem, joint swelling, myalgias and neck stiffness.  Skin: Negative for rash.  Neurological: Negative for dizziness, syncope, speech difficulty, weakness, numbness and headaches.  Hematological: Negative for adenopathy. Does not bruise/bleed easily.  Psychiatric/Behavioral: Negative for behavioral problems and dysphoric mood. The patient is not nervous/anxious.        Objective:  Physical Exam  Constitutional: He is oriented to person, place, and time. He appears well-developed.  HENT:  Head: Normocephalic.  Right Ear: External ear normal.  Left Ear: External ear normal.  Eyes: Conjunctivae and EOM are normal.  Neck: Normal range of motion.  4 cm, firm, nontender, left submandibular mass  Cardiovascular: Normal rate and normal heart sounds.   Pulmonary/Chest: Breath sounds normal.  Abdominal: Bowel sounds are normal.  Musculoskeletal: Normal range of motion. He exhibits no edema  and no tenderness.  Neurological: He is alert and oriented to person, place, and time.  Psychiatric: He has a normal mood and affect. His behavior is normal.          Assessment & Plan:   Left neck mass.  Probable sialoadenitis.  We'll treat and observe.  Prompt ENT referral.  If there is no prompt clinical improvement Allergic rhinitis.  Daily, Flonase.use  encouraged

## 2015-07-11 ENCOUNTER — Encounter: Payer: Self-pay | Admitting: Internal Medicine

## 2015-07-11 ENCOUNTER — Ambulatory Visit (INDEPENDENT_AMBULATORY_CARE_PROVIDER_SITE_OTHER): Payer: Managed Care, Other (non HMO) | Admitting: Internal Medicine

## 2015-07-11 VITALS — BP 110/70 | HR 84 | Temp 99.1°F | Resp 20 | Ht 70.75 in | Wt 221.0 lb

## 2015-07-11 DIAGNOSIS — Z859 Personal history of malignant neoplasm, unspecified: Secondary | ICD-10-CM | POA: Insufficient documentation

## 2015-07-11 DIAGNOSIS — J309 Allergic rhinitis, unspecified: Secondary | ICD-10-CM | POA: Diagnosis not present

## 2015-07-11 DIAGNOSIS — J069 Acute upper respiratory infection, unspecified: Secondary | ICD-10-CM | POA: Diagnosis not present

## 2015-07-11 DIAGNOSIS — C099 Malignant neoplasm of tonsil, unspecified: Secondary | ICD-10-CM | POA: Diagnosis not present

## 2015-07-11 MED ORDER — FLUTICASONE PROPIONATE 50 MCG/ACT NA SUSP: 2.0000 | Freq: Every day | NASAL | Status: DC

## 2015-07-11 MED ORDER — HYDROCODONE-HOMATROPINE 5-1.5 MG/5ML PO SYRP
5.0000 mL | ORAL_SOLUTION | Freq: Four times a day (QID) | ORAL | Status: DC | PRN
Start: 2015-07-11 — End: 2015-07-28

## 2015-07-11 NOTE — Patient Instructions (Signed)
Acute bronchitis symptoms are generally not helped by antibiotics.  Take over-the-counter expectorants and cough medications such as  Mucinex DM.  Call if there is no improvement in 5 to 7 days or if  you develop worsening cough, fever, or new symptoms, such as shortness of breath or chest pain.  HOME CARE INSTRUCTIONS  Drink plenty of water. Water helps thin the mucus so your sinuses can drain more easily.  Use a humidifier.  Inhale steam 3-4 times a day (for example, sit in the bathroom with the shower running).  Apply a warm, moist washcloth to your face 3-4 times a day, or as directed by your health care provider.  Use saline nasal sprays to help moisten and clean your sinuses.  Take medicines only as directed by your health care provider.

## 2015-07-11 NOTE — Progress Notes (Signed)
Subjective:    Patient ID: [name], male    DOB: Dec 20, 1970, 45 y.o.   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 Molecular Analysis: HPV+ 03/29/14 c1 cis 20x5 concurrent week1 XRT. 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  Symptoms intensified.  4 days ago.  He has 3 children that have the had a similar illness. He is followed closely by oncology at [name] due to tonsillar cancer.  He is scheduled for follow-up PET scanning in May 2017 He also describes intermittent wheezing.  He does have a history of allergic rhinitis and has been on fluticasone and has received immunotherapy in the past  Past Medical History  Diagnosis Date  . GERD (gastroesophageal reflux disease)     Social History   Social History  . Marital Status: Married    Spouse Name: N/A  . Number of Children: N/A  . Years of Education: N/A   Occupational History  . Not on file.   Social History Main Topics  . Smoking status: Never Smoker   . Smokeless tobacco: Never Used  . Alcohol Use: 1.5 oz/week    3 drink(s) per week  . Drug Use: No  . Sexual Activity: Not on file   Other Topics Concern  . Not on file   Social History Narrative    Past Surgical History  Procedure Laterality Date  . Surgery for shattered right patella  1986  . Left acl  1990    Family History  Problem Relation Age of Onset  . Hypothyroidism Mother   . Coronary artery disease Father     status post CABG, carotid endarterectomy, ablation for atrial fib  . Diabetes Father   . Atrial fibrillation Father   . Alcohol abuse Other   . Arthritis Other   . Diabetes Other   . Hyperlipidemia Other   . Hypertension Other   . Stroke Other     . Sudden death Other   . Heart disease Other     No Known Allergies  Current Outpatient Prescriptions on File Prior to Visit  Medication Sig Dispense Refill  . ibuprofen (ADVIL,MOTRIN) 200 MG tablet Take 200 mg by mouth as needed.     . Multiple Vitamin (MULTI-VITAMINS) TABS Take 1 tablet by mouth daily.      No current facility-administered medications on file prior to visit.    BP 110/70 mmHg  Pulse 84  Temp(Src) 99.1 F (37.3 C) (Oral)  Resp 20  Ht 5' 10.75" (1.797 m)  Wt 221 lb (100.245 kg)  BMI 31.04 kg/m2  SpO2 97%      Review of Systems  Constitutional: Positive for fever, activity change, appetite change and fatigue. Negative for chills.  HENT: Positive for congestion. Negative for dental problem, ear pain, hearing loss, sore throat, tinnitus, trouble swallowing and voice change.   Eyes: Negative for pain, discharge and visual disturbance.  Respiratory: Positive for cough and wheezing. Negative for chest tightness and stridor.   Cardiovascular: Negative for chest pain, palpitations and leg swelling.  Gastrointestinal: Negative for nausea, vomiting, abdominal pain, diarrhea, constipation, blood in stool and abdominal distention.  Genitourinary: Negative for urgency, hematuria, flank pain, discharge, difficulty  urinating and genital sores.  Musculoskeletal: Negative for myalgias, back pain, joint swelling, arthralgias, gait problem and neck stiffness.  Skin: Negative for rash.  Neurological: Negative for dizziness, syncope, speech difficulty, weakness, numbness and headaches.  Hematological: Negative for adenopathy. Does not bruise/bleed easily.  Psychiatric/Behavioral: Negative for behavioral problems and dysphoric mood. The patient is not nervous/anxious.        Objective:   Physical Exam  Constitutional: He is oriented to person, place, and time. He appears well-developed.  HENT:  Head: Normocephalic.  Right Ear: External ear normal.  Left Ear: External  ear normal.  Eyes: Conjunctivae and EOM are normal.  Neck: Normal range of motion.  Cardiovascular: Normal rate and normal heart sounds.   Pulmonary/Chest: Breath sounds normal. No respiratory distress. He has no wheezes. He has no rales.  Abdominal: Bowel sounds are normal.  Musculoskeletal: Normal range of motion. He exhibits no edema or tenderness.  Lymphadenopathy:    He has no cervical adenopathy.  Neurological: He is alert and oriented to person, place, and time.  Psychiatric: He has a normal mood and affect. His behavior is normal.          Assessment & Plan:   Viral URI with cough History of stage IV tonsillar cancer.  Follow-up [facility].  PET scan scheduled for May 2017  Will treat symptomatically.  Will also resume fluticasone nasal spray.  Due to his history of wheezing.  Was given samples of Advair 250/50 to use twice daily.  Will treat cough aggressively Report worsening fever, wheezing, or any new or worsening symptoms

## 2015-07-11 NOTE — Progress Notes (Signed)
Pre visit review using our clinic review tool, if applicable. No additional management support is needed unless otherwise documented below in the visit note. 

## 2015-07-28 ENCOUNTER — Telehealth: Payer: Self-pay | Admitting: Internal Medicine

## 2015-07-28 MED ORDER — HYDROCODONE-HOMATROPINE 5-1.5 MG/5ML PO SYRP: 5.0000 mL | ORAL_SOLUTION | Freq: Four times a day (QID) | ORAL | Status: DC | PRN

## 2015-07-28 NOTE — Telephone Encounter (Signed)
Generic Hydromet 6 ounce

## 2015-07-28 NOTE — Telephone Encounter (Signed)
Pt needs new rx for hydrocodone cough syrup. Pt has a cough

## 2015-07-28 NOTE — Telephone Encounter (Signed)
Pt notified Rx ready for pickup. Rx printed and signed.  

## 2015-07-28 NOTE — Telephone Encounter (Signed)
Please see message and advise 

## 2016-03-20 LAB — TSH: TSH: 1.75 u[IU]/mL (ref <=5.90)

## 2016-04-30 ENCOUNTER — Ambulatory Visit (INDEPENDENT_AMBULATORY_CARE_PROVIDER_SITE_OTHER): Payer: Managed Care, Other (non HMO) | Admitting: Family Medicine

## 2016-04-30 ENCOUNTER — Encounter: Payer: Self-pay | Admitting: Family Medicine

## 2016-04-30 ENCOUNTER — Other Ambulatory Visit (HOSPITAL_COMMUNITY)
Admission: RE | Admit: 2016-04-30 | Discharge: 2016-04-30 | Disposition: A | Discharge status: Home / Self Care | Payer: Managed Care, Other (non HMO) | Source: Ambulatory Visit | Attending: Family Medicine | Admitting: Family Medicine

## 2016-04-30 VITALS — BP 124/80 | HR 73 | Temp 98.3°F | Ht 70.75 in | Wt 232.3 lb

## 2016-04-30 DIAGNOSIS — Z202 Contact with and (suspected) exposure to infections with a predominantly sexual mode of transmission: Secondary | ICD-10-CM

## 2016-04-30 DIAGNOSIS — Z113 Encounter for screening for infections with a predominantly sexual mode of transmission: Secondary | ICD-10-CM | POA: Diagnosis not present

## 2016-04-30 DIAGNOSIS — N50819 Testicular pain, unspecified: Secondary | ICD-10-CM

## 2016-04-30 LAB — POCT URINALYSIS DIPSTICK
Bilirubin, UA: NEGATIVE
Blood, UA: NEGATIVE
Glucose, UA: NEGATIVE
Ketones, UA: NEGATIVE
Leukocytes, UA: NEGATIVE
Nitrite, UA: NEGATIVE
Protein, UA: NEGATIVE
Spec Grav, UA: >=1.03
Urobilinogen, UA: 0.2
pH, UA: 5

## 2016-04-30 MED ORDER — CEFTRIAXONE SODIUM 250 MG IJ SOLR
250.0000 mg | Freq: Once | INTRAMUSCULAR | Status: AC
  Administered 2016-04-30: 250 mg via INTRAMUSCULAR

## 2016-04-30 MED ORDER — METRONIDAZOLE 375 MG PO CAPS: 375.0000 mg | ORAL_CAPSULE | Freq: Two times a day (BID) | ORAL | 0 refills | Status: DC

## 2016-04-30 MED ORDER — AZITHROMYCIN 500 MG PO TABS: 1000.0000 mg | ORAL_TABLET | Freq: Every day | ORAL | 0 refills | Status: DC

## 2016-04-30 NOTE — Progress Notes (Signed)
HPI:  Kyle [name] is a pleasant 45 year old here for an acute visit for STI exposure. He reports his wife told him last night that she had Trichomonas and has been unfaithful. She gave him Flagyl to take that she had gotten from her doctor. She reported negative testing for other STIs, but he wants testing and treatment for trich/GC and Chlam. Wants rx for trich from me as reports he doesn't want to take rx from his wife. He is very anxious about this. He also reports he read on the Internet about a link between trichomonas infection and testicular cancer he plans to call his oncologist about this. He reports she told the schedule he had testicular pain to get the appointment. However he reports he does not have any current testicular pain, penile pain, fever, rash dysuria or discharge. Hx of remote testicular pain that her reports he discussed with his PCP and had had normal exam.  ROS: See pertinent positives and negatives per HPI.  Past Medical History:  Diagnosis Date  . GERD (gastroesophageal reflux disease)     Past Surgical History:  Procedure Laterality Date  . left ACL  1990  . surgery for shattered right patella  1986    Family History  Problem Relation Age of Onset  . Hypothyroidism Mother   . Coronary artery disease Father     status post CABG, carotid endarterectomy, ablation for atrial fib  . Diabetes Father   . Atrial fibrillation Father   . Alcohol abuse Other   . Arthritis Other   . Diabetes Other   . Hyperlipidemia Other   . Hypertension Other   . Stroke Other   . Sudden death Other   . Heart disease Other     Social History   Social History  . Marital status: Married    Spouse name: N/A  . Number of children: N/A  . Years of education: N/A   Social History Main Topics  . Smoking status: Never Smoker  . Smokeless tobacco: Never Used  . Alcohol use 1.5 oz/week    3 drink(s) per week  . Drug use: No  . Sexual activity: Not Asked   Other Topics  Concern  . None   Social History Narrative  . None     Current Outpatient Prescriptions:  .  fluticasone (FLONASE) 50 MCG/ACT nasal spray, Place 2 sprays into both nostrils daily., Disp: 16 g, Rfl: 5 .  HYDROcodone-homatropine (HYCODAN) 5-1.5 MG/5ML syrup, Take 5 mLs by mouth every 6 (six) hours as needed for cough., Disp: 120 mL, Rfl: 0 .  ibuprofen (ADVIL,MOTRIN) 200 MG tablet, Take 200 mg by mouth as needed. , Disp: , Rfl:  .  Multiple Vitamin (MULTI-VITAMINS) TABS, Take 1 tablet by mouth daily. , Disp: , Rfl:  .  azithromycin (ZITHROMAX) 500 MG tablet, Take 2 tablets (1,000 mg total) by mouth daily., Disp: 2 tablet, Rfl: 0 .  metronidazole (FLAGYL) 375 MG capsule, Take 1 capsule (375 mg total) by mouth 2 (two) times daily., Disp: 14 capsule, Rfl: 0  EXAM:  Vitals:   04/30/16 1438  BP: 124/80  Pulse: 73  Temp: 98.3 F (36.8 C)    Body mass index is 32.63 kg/m.  GENERAL: vitals reviewed and listed above, alert, oriented, appears well hydrated and in no acute distress  HEENT: atraumatic, conjunttiva clear, no obvious abnormalities on inspection of external nose and ears  NECK: no obvious masses on inspection  LUNGS: clear to auscultation bilaterally, no wheezes, rales  or rhonchi, good air movement  CV: HRRR, no peripheral edema  GU: normal exam  MS: moves all extremities without noticeable abnormality  PSYCH: pleasant and cooperative, no obvious depression or anxiety  ASSESSMENT AND PLAN:  Discussed the following assessment and plan:  Exposure to trichomonas - Plan: azithromycin (ZITHROMAX) 500 MG tablet, metronidazole (FLAGYL) 375 MG capsule, HIV antibody (with reflex), RPR, cefTRIAXone (ROCEPHIN) injection 250 mg, Culture, Urine, Urine cytology ancillary only  Testicular pain - Plan: POC Urinalysis Dipstick  -discussed testing, transmission, symptoms and treatment for trich and other STIs - he wants testing for trich/GC/Chlam from urine and HIV and RPR.  Understands limitations. He also wants empiric tx for GC/Chlam and trich after discussion risks.  -Patient advised to return or notify a doctor immediately if symptoms worsen or persist or new concerns arise.  Patient Instructions  BEFORE YOU LEAVE: -lab  Take the antibiotic as instructed. No intercourse until 7 days after treatment.  See your urologist about any testicular pain and follow up with your cancer specialist about your questions.  We have ordered labs or studies at this visit. It can take up to 1-2 weeks for results and processing. IF results require follow up or explanation, we will call you with instructions. Clinically stable results will be released to your [facility]. If you have not heard from [place] or cannot find your results in [facility] in 2 weeks please contact our office at [phone].  If you are not yet signed up for [facility], please consider signing up.         Colin Benton R., DO

## 2016-04-30 NOTE — Patient Instructions (Addendum)
BEFORE YOU LEAVE: -lab  Take the antibiotic as instructed. No intercourse until 7 days after treatment.  See your urologist about any testicular pain and follow up with your cancer specialist about your questions.  We have ordered labs or studies at this visit. It can take up to 1-2 weeks for results and processing. IF results require follow up or explanation, we will call you with instructions. Clinically stable results will be released to your [facility]. If you have not heard from [place] or cannot find your results in [facility] in 2 weeks please contact our office at [phone].  If you are not yet signed up for [facility], please consider signing up.

## 2016-04-30 NOTE — Progress Notes (Signed)
Pre visit review using our clinic review tool, if applicable. No additional management support is needed unless otherwise documented below in the visit note. 

## 2016-05-01 LAB — URINE CYTOLOGY ANCILLARY ONLY
Chlamydia: NEGATIVE
Neisseria Gonorrhea: NEGATIVE
Trichomonas: NEGATIVE

## 2016-05-01 LAB — RPR

## 2016-05-01 LAB — HIV ANTIBODY (ROUTINE TESTING W REFLEX): HIV 1&2 Ab, 4th Generation: NONREACTIVE

## 2016-05-02 ENCOUNTER — Encounter: Payer: Self-pay | Admitting: Family Medicine

## 2016-05-02 LAB — URINE CULTURE: Organism ID, Bacteria: NO GROWTH

## 2016-05-03 ENCOUNTER — Other Ambulatory Visit: Payer: Self-pay | Admitting: Family Medicine

## 2016-05-03 MED ORDER — VALACYCLOVIR HCL 1 G PO TABS: 2000.0000 mg | ORAL_TABLET | Freq: Two times a day (BID) | ORAL | 0 refills | Status: AC

## 2016-05-03 NOTE — Telephone Encounter (Signed)
Spoke to pt, told him Dr. Raliegh Ip is out of the office till Tues and I can not fill Valtrex. Pt said he meant for the message to go to Dr.Kim who he saw on Monday to see if she would give him a Rx for Valtrex. Told him I will send message to Dr. Maudie Mercury. Pt verbalized understanding. Also in regards to form. Pt said he already got that taken care of. Told him okay.

## 2016-05-03 NOTE — Telephone Encounter (Signed)
Dr.Kim, please see message regarding Valtrex Rx and advise. Thanks

## 2016-10-31 ENCOUNTER — Encounter: Payer: Self-pay | Admitting: Family Medicine

## 2016-11-22 ENCOUNTER — Telehealth: Payer: Self-pay | Admitting: Internal Medicine

## 2016-11-22 NOTE — Telephone Encounter (Signed)
Pt's last OV with Dr Raliegh Ip on 07/11/15 OV with Dr Maudie Mercury on 04/20/16  Called pt to schedule an appointment, unable to contact. Left message on voicemail to call office.

## 2016-11-22 NOTE — Telephone Encounter (Signed)
° ° ° °  Pt call to say Dr Raliegh Ip had written him a RX  For Valtrex a while ago and he called today to ask if that RX can be written again pt is aware Dr Raliegh Ip is out of the office until next week

## 2016-11-22 NOTE — Telephone Encounter (Signed)
Spoke with pt and informed him that he has not been seen in over a yr. He needs an OV to get any prescriptions. Pt states he will attempt to get his oncologist to give him the Rx if not he will call the office back to get an appointment.

## 2016-12-18 ENCOUNTER — Encounter: Payer: Self-pay | Admitting: Internal Medicine

## 2017-01-21 ENCOUNTER — Ambulatory Visit (INDEPENDENT_AMBULATORY_CARE_PROVIDER_SITE_OTHER): Payer: 59 | Admitting: *Deleted

## 2017-01-21 DIAGNOSIS — Z23 Encounter for immunization: Secondary | ICD-10-CM

## 2017-03-07 ENCOUNTER — Encounter: Payer: Self-pay | Admitting: Internal Medicine

## 2017-11-26 ENCOUNTER — Encounter: Payer: 59 | Admitting: Internal Medicine

## 2017-11-27 ENCOUNTER — Ambulatory Visit (INDEPENDENT_AMBULATORY_CARE_PROVIDER_SITE_OTHER): Payer: BLUE CROSS/BLUE SHIELD | Admitting: Internal Medicine

## 2017-11-27 ENCOUNTER — Encounter: Payer: Self-pay | Admitting: Internal Medicine

## 2017-11-27 VITALS — BP 116/68 | HR 76 | Temp 98.9°F | Wt 240.0 lb

## 2017-11-27 DIAGNOSIS — Z0000 Encounter for general adult medical examination without abnormal findings: Secondary | ICD-10-CM | POA: Diagnosis not present

## 2017-11-27 NOTE — Patient Instructions (Addendum)
It is important that you exercise regularly, at least 20 minutes 3 to 4 times per week.  If you develop chest pain or shortness of breath seek  medical attention.  You need to lose weight.  Consider a lower calorie diet and regular exercise.   Health Maintenance, Male A healthy lifestyle and preventive care is important for your health and wellness. Ask your health care provider about what schedule of regular examinations is right for you. What should I know about weight and diet? Eat a Healthy Diet  Eat plenty of vegetables, fruits, whole grains, low-fat dairy products, and lean protein.  Do not eat a lot of foods high in solid fats, added sugars, or salt.  Maintain a Healthy Weight Regular exercise can help you achieve or maintain a healthy weight. You should:  Do at least 150 minutes of exercise each week. The exercise should increase your heart rate and make you sweat (moderate-intensity exercise).  Do strength-training exercises at least twice a week.  Watch Your Levels of Cholesterol and Blood Lipids  Have your blood tested for lipids and cholesterol every 5 years starting at 49 years of age. If you are at high risk for heart disease, you should start having your blood tested when you are 49 years old. You may need to have your cholesterol levels checked more often if: ? Your lipid or cholesterol levels are high. ? You are older than 49 years of age. ? You are at high risk for heart disease.  What should I know about cancer screening? Many types of cancers can be detected early and may often be prevented. Lung Cancer  You should be screened every year for lung cancer if: ? You are a current smoker who has smoked for at least 30 years. ? You are a former smoker who has quit within the past 15 years.  Talk to your health care provider about your screening options, when you should start screening, and how often you should be screened.  Colorectal Cancer  Routine  colorectal cancer screening usually begins at 49 years of age and should be repeated every 5-10 years until you are 49 years old. You may need to be screened more often if early forms of precancerous polyps or small growths are found. Your health care provider may recommend screening at an earlier age if you have risk factors for colon cancer.  Your health care provider may recommend using home test kits to check for hidden blood in the stool.  A small camera at the end of a tube can be used to examine your colon (sigmoidoscopy or colonoscopy). This checks for the earliest forms of colorectal cancer.  Prostate and Testicular Cancer  Depending on your age and overall health, your health care provider may do certain tests to screen for prostate and testicular cancer.  Talk to your health care provider about any symptoms or concerns you have about testicular or prostate cancer.  Skin Cancer  Check your skin from head to toe regularly.  Tell your health care provider about any new moles or changes in moles, especially if: ? There is a change in a mole's size, shape, or color. ? You have a mole that is larger than a pencil eraser.  Always use sunscreen. Apply sunscreen liberally and repeat throughout the day.  Protect yourself by wearing long sleeves, pants, a wide-brimmed hat, and sunglasses when outside.  What should I know about heart disease, diabetes, and high blood pressure?  If you  are 58-92 years of age, have your blood pressure checked every 3-5 years. If you are 51 years of age or older, have your blood pressure checked every year. You should have your blood pressure measured twice-once when you are at a hospital or clinic, and once when you are not at a hospital or clinic. Record the average of the two measurements. To check your blood pressure when you are not at a hospital or clinic, you can use: ? An automated blood pressure machine at a pharmacy. ? A home blood pressure  monitor.  Talk to your health care provider about your target blood pressure.  If you are between 27-62 years old, ask your health care provider if you should take aspirin to prevent heart disease.  Have regular diabetes screenings by checking your fasting blood sugar level. ? If you are at a normal weight and have a low risk for diabetes, have this test once every three years after the age of 67. ? If you are overweight and have a high risk for diabetes, consider being tested at a younger age or more often.  A one-time screening for abdominal aortic aneurysm (AAA) by ultrasound is recommended for men aged 52-75 years who are current or former smokers. What should I know about preventing infection? Hepatitis B If you have a higher risk for hepatitis B, you should be screened for this virus. Talk with your health care provider to find out if you are at risk for hepatitis B infection. Hepatitis C Blood testing is recommended for:  Everyone born from [birth year] through 1967.  Anyone with known risk factors for hepatitis C.  Sexually Transmitted Diseases (STDs)  You should be screened each year for STDs including gonorrhea and chlamydia if: ? You are sexually active and are younger than 49 years of age. ? You are older than 49 years of age and your health care provider tells you that you are at risk for this type of infection. ? Your sexual activity has changed since you were last screened and you are at an increased risk for chlamydia or gonorrhea. Ask your health care provider if you are at risk.  Talk with your health care provider about whether you are at high risk of being infected with HIV. Your health care provider may recommend a prescription medicine to help prevent HIV infection.  What else can I do?  Schedule regular health, dental, and eye exams.  Stay current with your vaccines (immunizations).  Do not use any tobacco products, such as cigarettes, chewing tobacco, and  e-cigarettes. If you need help quitting, ask your health care provider.  Limit alcohol intake to no more than 2 drinks per day. One drink equals 12 ounces of beer, 5 ounces of wine, or 1 ounces of hard liquor.  Do not use street drugs.  Do not share needles.  Ask your health care provider for help if you need support or information about quitting drugs.  Tell your health care provider if you often feel depressed.  Tell your health care provider if you have ever been abused or do not feel safe at home. This information is not intended to replace advice given to you by your health care provider. Make sure you discuss any questions you have with your health care provider. Document Released: 12/01/2007 Document Revised: 02/01/2016 Document Reviewed: 03/08/2015 Elsevier Interactive Patient Education  [employer].

## 2017-11-27 NOTE — Progress Notes (Signed)
Subjective:    Patient ID: Kyle [name], male    DOB: 24-Oct-1970, 47 y.o.   MRN: [id number]  HPI  47 year old patient who is followed closely at [facility] with a history of tonsillar cancer.  He undergoes endoscopy every 6 months as well as annual imaging studies.   Blood work is obtained also semiannually. He is seen today for preventive health examination.  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 Clinically doing quite well and takes no chronic medications.  Past Medical History:  Diagnosis Date  . GERD (gastroesophageal reflux disease)      Social History   Socioeconomic History  . Marital status: Married    Spouse name: Not on file  . Number of children: Not on file  . Years of education: Not on file  . Highest education level: Not on file  Occupational History  . Not on file  Social Needs  . Financial resource strain: Not on file  . Food insecurity:    Worry: Not on file    Inability: Not on file  . Transportation needs:    Medical: Not on file    Non-medical: Not on file  Tobacco Use  . Smoking status: Never Smoker  . Smokeless tobacco: Never Used  Substance and Sexual Activity  . Alcohol use: Yes    Alcohol/week: 1.5 oz    Types: 3 drink(s) per week  . Drug use: No  . Sexual activity: Not on file  Lifestyle  . Physical activity:    Days per week: Not on file    Minutes per session: Not on file  . Stress: Not on file  Relationships  . Social connections:    Talks on phone: Not on file    Gets together: Not on file    Attends religious service: Not on file    Active member of club or organization: Not on file    Attends meetings of clubs or organizations: Not on file    Relationship status: Not on file  . Intimate partner violence:    Fear of current or ex partner: Not on file    Emotionally abused: Not on file    Physically abused: Not on file    Forced sexual activity: Not on  file  Other Topics Concern  . Not on file  Social History Narrative  . Not on file    Past Surgical History:  Procedure Laterality Date  . left ACL  1990  . surgery for shattered right patella  1986    Family History  Problem Relation Age of Onset  . Hypothyroidism Mother   . Coronary artery disease Father        status post CABG, carotid endarterectomy, ablation for atrial fib  . Diabetes Father   . Atrial fibrillation Father   . Alcohol abuse Other   . Arthritis Other   . Diabetes Other   . Hyperlipidemia Other   . Hypertension Other   . Stroke Other   . Sudden death Other   . Heart disease Other     No Known Allergies  Current Outpatient Medications on File Prior to Visit  Medication Sig Dispense Refill  . Multiple Vitamin (MULTI-VITAMINS) TABS Take 1 tablet by mouth daily.     . fluticasone (FLONASE) 50 MCG/ACT nasal spray Place 2 sprays into both nostrils daily. 16 g 5  . metronidazole (FLAGYL) 375 MG capsule Take 1 capsule (375 mg  total) by mouth 2 (two) times daily. (Patient not taking: Reported on 11/27/2017) 14 capsule 0   No current facility-administered medications on file prior to visit.     BP 116/68 (BP Location: Right Arm, Patient Position: Sitting, Cuff Size: Large)   Pulse 76   Temp 98.9 F (37.2 C) (Oral)   Wt 240 lb (108.9 kg)   SpO2 96%   BMI 33.71 kg/m    Review of Systems  Constitutional: Negative for activity change, appetite change, chills, fatigue and fever.  HENT: Negative for congestion, dental problem, ear pain, hearing loss, mouth sores, rhinorrhea, sinus pressure, sneezing, sore throat, tinnitus, trouble swallowing and voice change.   Eyes: Negative for photophobia, pain, discharge, redness and visual disturbance.  Respiratory: Negative for apnea, cough, choking, chest tightness, shortness of breath, wheezing and stridor.   Cardiovascular: Negative for chest pain, palpitations and leg swelling.  Gastrointestinal: Negative for  abdominal distention, abdominal pain, anal bleeding, blood in stool, constipation, diarrhea, nausea, rectal pain and vomiting.  Genitourinary: Negative for decreased urine volume, difficulty urinating, discharge, dysuria, flank pain, frequency, genital sores, hematuria, penile swelling, scrotal swelling, testicular pain and urgency.  Musculoskeletal: Negative for arthralgias, back pain, gait problem, joint swelling, myalgias, neck pain and neck stiffness.  Skin: Negative for color change, rash and wound.  Neurological: Negative for dizziness, tremors, seizures, syncope, facial asymmetry, speech difficulty, weakness, light-headedness, numbness and headaches.  Hematological: Negative for adenopathy. Does not bruise/bleed easily.  Psychiatric/Behavioral: Negative for agitation, behavioral problems, confusion, decreased concentration, dysphoric mood, hallucinations, self-injury, sleep disturbance and suicidal ideas. The patient is not nervous/anxious.        Objective:   Physical Exam  Constitutional: He is oriented to person, place, and time. He appears well-developed.  Blood pressure 120/78 Weight 240  HENT:  Head: Normocephalic.  Right Ear: External ear normal.  Left Ear: External ear normal.  Eyes: Conjunctivae and EOM are normal.  Neck: Normal range of motion.  Some fullness and induration about the left carotid upstroke but no discrete adenopathy.  Probably radiation changes  Cardiovascular: Normal rate and normal heart sounds.  Pulmonary/Chest: Breath sounds normal.  Abdominal: Bowel sounds are normal.  Genitourinary: Penis normal.  Musculoskeletal: Normal range of motion. He exhibits no edema or tenderness.  Neurological: He is alert and oriented to person, place, and time.  Psychiatric: He has a normal mood and affect. His behavior is normal.          Assessment & Plan:   Preventive health examination History of left tonsillar cancer.  Follow-up [facility]  Forms completed Return in 1 year or as needed Colonoscopy age 75  Marletta Lor

## 2018-01-06 DIAGNOSIS — C099 Malignant neoplasm of tonsil, unspecified: Secondary | ICD-10-CM | POA: Diagnosis not present

## 2018-01-06 DIAGNOSIS — Z08 Encounter for follow-up examination after completed treatment for malignant neoplasm: Secondary | ICD-10-CM | POA: Diagnosis not present

## 2018-01-06 DIAGNOSIS — Z923 Personal history of irradiation: Secondary | ICD-10-CM | POA: Diagnosis not present

## 2018-01-06 DIAGNOSIS — Z85818 Personal history of malignant neoplasm of other sites of lip, oral cavity, and pharynx: Secondary | ICD-10-CM | POA: Diagnosis not present

## 2018-01-06 DIAGNOSIS — C770 Secondary and unspecified malignant neoplasm of lymph nodes of head, face and neck: Secondary | ICD-10-CM | POA: Diagnosis not present

## 2018-01-06 DIAGNOSIS — Z9221 Personal history of antineoplastic chemotherapy: Secondary | ICD-10-CM | POA: Diagnosis not present

## 2018-05-01 ENCOUNTER — Ambulatory Visit: Payer: BLUE CROSS/BLUE SHIELD | Admitting: Family Medicine

## 2018-05-01 ENCOUNTER — Encounter: Payer: Self-pay | Admitting: Family Medicine

## 2018-05-01 VITALS — BP 120/80 | HR 82 | Temp 98.8°F | Wt 238.0 lb

## 2018-05-01 DIAGNOSIS — J101 Influenza due to other identified influenza virus with other respiratory manifestations: Secondary | ICD-10-CM

## 2018-05-01 DIAGNOSIS — R52 Pain, unspecified: Secondary | ICD-10-CM

## 2018-05-01 LAB — POCT INFLUENZA A/B
Influenza A, POC: POSITIVE — AB
Influenza B, POC: NEGATIVE

## 2018-05-01 NOTE — Patient Instructions (Signed)

## 2018-05-01 NOTE — Progress Notes (Signed)
Subjective:    Patient ID: Kyle [name], male    DOB: [birth year], 47 y.o.   MRN: [id number]  No chief complaint on file.   HPI Patient was seen today for acute concern.  Pt endorses cough, fever T-max 102, sore throat, nasal drainage, congestion, cold sweats, muscle aches since Saturday.  Pt has taken NyQuil for his symptoms.  Sick contacts include coworkers who are also sick after a trip to [facility].  Past Medical History:  Diagnosis Date  . GERD (gastroesophageal reflux disease)     No Known Allergies  ROS General: Denies night sweats, changes in weight, changes in appetite+ fever, chills HEENT: Denies headaches, ear pain, changes in vision + nasal congestion, rhinorrhea, sore throat CV: Denies CP, palpitations, SOB, orthopnea Pulm: Denies SOB, cough, wheezing GI: Denies abdominal pain, nausea, vomiting, diarrhea, constipation GU: Denies dysuria, hematuria, frequency, vaginal discharge Msk: Denies muscle cramps, joint pains  + muscle aches Neuro: Denies weakness, numbness, tingling Skin: Denies rashes, bruising Psych: Denies depression, anxiety, hallucinations    Objective:    Blood pressure 120/80, pulse 82, temperature 98.8 F (37.1 C), temperature source Oral, weight 238 lb (108 kg), SpO2 96 %.  Gen. Pleasant, well-nourished, in no distress but appears mildly uncomfortable, normal affect   HEENT: /AT, face symmetric,  no scleral icterus, PERRLA, nares patent without drainage, pharynx without erythema or exudate.  TMs full b/l. Lungs: no accessory muscle use, CTAB, no wheezes or rales Cardiovascular: RRR, no m/r/g, no peripheral edema Neuro:  A&Ox3, CN II-XII intact, normal gait  Wt Readings from Last 3 Encounters:  05/01/18 238 lb (108 kg)  11/27/17 240 lb (108.9 kg)  04/30/16 232 lb 4.8 oz (105.4 kg)    Lab Results  Component Value Date   WBC 6.1 12/21/2013   HGB 14.8 12/21/2013   HCT 44.1 12/21/2013   PLT 232.0 12/21/2013   GLUCOSE 109 (H) 12/21/2013    CHOL 205 (H) 12/21/2013   TRIG 95.0 12/21/2013   HDL 50.00 12/21/2013   LDLCALC 136 (H) 12/21/2013   ALT 33 12/21/2013   AST 26 12/21/2013   NA 139 12/21/2013   K 3.9 12/21/2013   CL 103 12/21/2013   CREATININE 1.0 12/21/2013   BUN 17 12/21/2013   CO2 29 12/21/2013   TSH 1.75 03/20/2016   PSA 0.40 12/10/2008    Assessment/Plan:  Influenza A  -supportive care -outside of window for antiviral meds -given handout  Body aches -rapid flu---positive for influenza A  F/u prn  Grier Mitts, MD

## 2018-05-01 NOTE — Addendum Note (Signed)
Addended by: Wyvonne Lenz on: 05/01/2018 03:19 PM   Modules accepted: Orders

## 2018-06-09 ENCOUNTER — Ambulatory Visit: Payer: BLUE CROSS/BLUE SHIELD | Admitting: Family Medicine

## 2018-06-09 ENCOUNTER — Encounter: Payer: Self-pay | Admitting: Family Medicine

## 2018-06-09 ENCOUNTER — Ambulatory Visit (INDEPENDENT_AMBULATORY_CARE_PROVIDER_SITE_OTHER): Payer: BLUE CROSS/BLUE SHIELD

## 2018-06-09 VITALS — BP 130/80 | HR 85 | Temp 98.4°F | Wt 239.4 lb

## 2018-06-09 DIAGNOSIS — R059 Cough, unspecified: Secondary | ICD-10-CM

## 2018-06-09 DIAGNOSIS — R05 Cough: Secondary | ICD-10-CM

## 2018-06-09 MED ORDER — AMOXICILLIN-POT CLAVULANATE 875-125 MG PO TABS: 1.0000 | ORAL_TABLET | Freq: Two times a day (BID) | ORAL | 0 refills | Status: AC

## 2018-06-09 NOTE — Progress Notes (Signed)
  JEROMEY KRUER DOB: Jul 13, 1970 Encounter date: 06/09/2018  This is a 47 y.o. male who presents with Chief Complaint  Patient presents with  . Cough    had the flu in november, has had a cough sinse, runny nose, productive cough, wheezing     History of present illness:  Was here over a month ago - dx with flu. Did feel like he recovered after a couple of weeks. Then about 5 days ago started getting watery eyes; seemed like allergies. Then coughing up green phlegm, green nasal discharge. At night bothering wife. Yesterday felt bad; last night ran low grade fever. Took mucinex last night which helped. Feels better today.  Wife states he is wheezing.      No known sick contacts.  No Known Allergies Current Meds  Medication Sig  . Multiple Vitamin (MULTI-VITAMINS) TABS Take 1 tablet by mouth daily.     Review of Systems  Constitutional: Positive for activity change. Fever: low grade.  HENT: Positive for congestion, sinus pressure and sinus pain. Negative for ear pain and sore throat.   Respiratory: Positive for cough and wheezing. Negative for shortness of breath.     Objective:  BP 130/80 (BP Location: Left Arm, Patient Position: Sitting, Cuff Size: Large)   Pulse 85   Temp 98.4 F (36.9 C) (Oral)   Wt 239 lb 6.4 oz (108.6 kg)   SpO2 96%   BMI 33.63 kg/m   Weight: 239 lb 6.4 oz (108.6 kg)   BP Readings from Last 3 Encounters:  06/09/18 130/80  05/01/18 120/80  11/27/17 116/68   Wt Readings from Last 3 Encounters:  06/09/18 239 lb 6.4 oz (108.6 kg)  05/01/18 238 lb (108 kg)  11/27/17 240 lb (108.9 kg)    Physical Exam Constitutional:      General: He is not in acute distress.    Appearance: He is well-developed.  Cardiovascular:     Rate and Rhythm: Normal rate and regular rhythm.     Heart sounds: Normal heart sounds. No murmur. No friction rub.     Comments: No lower extremity edema Pulmonary:     Effort: Pulmonary effort is normal. No respiratory  distress.     Breath sounds: Normal breath sounds. No decreased breath sounds, wheezing, rhonchi or rales.  Neurological:     Mental Status: He is alert and oriented to person, place, and time.  Psychiatric:        Behavior: Behavior normal.     Assessment/Plan    1. Cough Antibiotic only if worsening of symptoms. Will call with xray results when available. Let us know if any worsening.   Return if symptoms worsen or fail to improve.     Micheline Rough, MD

## 2018-08-13 DIAGNOSIS — R05 Cough: Secondary | ICD-10-CM | POA: Diagnosis not present

## 2018-12-18 DIAGNOSIS — Z08 Encounter for follow-up examination after completed treatment for malignant neoplasm: Secondary | ICD-10-CM | POA: Diagnosis not present

## 2018-12-18 DIAGNOSIS — Z9221 Personal history of antineoplastic chemotherapy: Secondary | ICD-10-CM | POA: Diagnosis not present

## 2018-12-18 DIAGNOSIS — C099 Malignant neoplasm of tonsil, unspecified: Secondary | ICD-10-CM | POA: Diagnosis not present

## 2018-12-18 DIAGNOSIS — C770 Secondary and unspecified malignant neoplasm of lymph nodes of head, face and neck: Secondary | ICD-10-CM | POA: Diagnosis not present

## 2018-12-18 DIAGNOSIS — Z85810 Personal history of malignant neoplasm of tongue: Secondary | ICD-10-CM | POA: Diagnosis not present

## 2018-12-18 DIAGNOSIS — Z923 Personal history of irradiation: Secondary | ICD-10-CM | POA: Diagnosis not present

## 2019-01-26 DIAGNOSIS — Z20828 Contact with and (suspected) exposure to other viral communicable diseases: Secondary | ICD-10-CM | POA: Diagnosis not present

## 2019-01-31 DIAGNOSIS — Z20828 Contact with and (suspected) exposure to other viral communicable diseases: Secondary | ICD-10-CM | POA: Diagnosis not present

## 2019-06-03 DIAGNOSIS — C099 Malignant neoplasm of tonsil, unspecified: Secondary | ICD-10-CM | POA: Diagnosis not present

## 2019-06-03 DIAGNOSIS — Z08 Encounter for follow-up examination after completed treatment for malignant neoplasm: Secondary | ICD-10-CM | POA: Diagnosis not present

## 2019-06-03 DIAGNOSIS — C700 Malignant neoplasm of cerebral meninges: Secondary | ICD-10-CM | POA: Diagnosis not present

## 2019-06-03 DIAGNOSIS — Z85818 Personal history of malignant neoplasm of other sites of lip, oral cavity, and pharynx: Secondary | ICD-10-CM | POA: Diagnosis not present

## 2019-06-03 DIAGNOSIS — Z923 Personal history of irradiation: Secondary | ICD-10-CM | POA: Diagnosis not present

## 2019-06-03 DIAGNOSIS — C770 Secondary and unspecified malignant neoplasm of lymph nodes of head, face and neck: Secondary | ICD-10-CM | POA: Diagnosis not present

## 2019-06-03 DIAGNOSIS — Z9221 Personal history of antineoplastic chemotherapy: Secondary | ICD-10-CM | POA: Diagnosis not present

## 2020-02-18 DIAGNOSIS — Z20828 Contact with and (suspected) exposure to other viral communicable diseases: Secondary | ICD-10-CM | POA: Diagnosis not present

## 2020-06-01 DIAGNOSIS — Z79899 Other long term (current) drug therapy: Secondary | ICD-10-CM | POA: Diagnosis not present

## 2020-06-01 DIAGNOSIS — Z08 Encounter for follow-up examination after completed treatment for malignant neoplasm: Secondary | ICD-10-CM | POA: Diagnosis not present

## 2020-06-01 DIAGNOSIS — C770 Secondary and unspecified malignant neoplasm of lymph nodes of head, face and neck: Secondary | ICD-10-CM | POA: Diagnosis not present

## 2020-06-01 DIAGNOSIS — C099 Malignant neoplasm of tonsil, unspecified: Secondary | ICD-10-CM | POA: Diagnosis not present

## 2020-06-01 DIAGNOSIS — Z85818 Personal history of malignant neoplasm of other sites of lip, oral cavity, and pharynx: Secondary | ICD-10-CM | POA: Diagnosis not present

## 2020-07-02 DIAGNOSIS — Z03818 Encounter for observation for suspected exposure to other biological agents ruled out: Secondary | ICD-10-CM | POA: Diagnosis not present

## 2020-07-29 ENCOUNTER — Telehealth: Payer: Self-pay | Admitting: Internal Medicine

## 2020-07-29 ENCOUNTER — Encounter: Payer: Self-pay | Admitting: Internal Medicine

## 2020-07-29 NOTE — Telephone Encounter (Signed)
Done

## 2020-07-29 NOTE — Telephone Encounter (Signed)
Patient requesting screening colonoscopy with me  Needs a call to set up pre-visit and colonoscopy  direct

## 2020-09-06 ENCOUNTER — Ambulatory Visit (AMBULATORY_SURGERY_CENTER): Payer: Self-pay | Admitting: *Deleted

## 2020-09-06 ENCOUNTER — Other Ambulatory Visit: Payer: Self-pay

## 2020-09-06 VITALS — Ht 71.0 in | Wt 250.0 lb

## 2020-09-06 DIAGNOSIS — Z1211 Encounter for screening for malignant neoplasm of colon: Secondary | ICD-10-CM

## 2020-09-06 NOTE — Progress Notes (Signed)
  No trouble with anesthesia, denies being told they were difficult to intubate, or hx/fam hx of malignant hyperthermia per pt   No egg or soy allergy  No home oxygen use   No medications for weight loss taken  emmi information given  Pt denies constipation issues  

## 2020-09-20 ENCOUNTER — Encounter: Payer: Self-pay | Admitting: Internal Medicine

## 2020-09-22 ENCOUNTER — Ambulatory Visit (AMBULATORY_SURGERY_CENTER): Payer: BC Managed Care – PPO | Admitting: Internal Medicine

## 2020-09-22 ENCOUNTER — Encounter: Payer: Self-pay | Admitting: Internal Medicine

## 2020-09-22 ENCOUNTER — Other Ambulatory Visit: Payer: Self-pay

## 2020-09-22 VITALS — BP 119/56 | HR 61 | Temp 98.6°F | Resp 21 | Ht 70.0 in | Wt 250.0 lb

## 2020-09-22 DIAGNOSIS — Z1211 Encounter for screening for malignant neoplasm of colon: Secondary | ICD-10-CM | POA: Diagnosis not present

## 2020-09-22 DIAGNOSIS — D128 Benign neoplasm of rectum: Secondary | ICD-10-CM

## 2020-09-22 DIAGNOSIS — K621 Rectal polyp: Secondary | ICD-10-CM

## 2020-09-22 MED ORDER — SODIUM CHLORIDE 0.9 % IV SOLN: 500.0000 mL | Freq: Once | INTRAVENOUS | Status: DC

## 2020-09-22 NOTE — Patient Instructions (Addendum)
I removed one tiny polyp at the very end of the rectum. I suspect this will result in some bleeding with bowel movements at the beginning - should not be a lot.  You also have a condition called diverticulosis - common and not usually a problem. Please read the handout provided.  Prostate is normal.  I will let you know pathology results and when to have another routine colonoscopy by mail and/or My Chart.  I appreciate the opportunity to care for you. Gatha Mayer, MD, [facility]  Resume previous diet Continue current medications Await pathology results  YOU HAD AN ENDOSCOPIC PROCEDURE TODAY AT [facility]:   Refer to the procedure report that was given to you for any specific questions about what was found during the examination.  If the procedure report does not answer your questions, please call your gastroenterologist to clarify.  If you requested that your care partner not be given the details of your procedure findings, then the procedure report has been included in a sealed envelope for you to review at your convenience later.  YOU SHOULD EXPECT: Some feelings of bloating in the abdomen. Passage of more gas than usual.  Walking can help get rid of the air that was put into your GI tract during the procedure and reduce the bloating. If you had a lower endoscopy (such as a colonoscopy or flexible sigmoidoscopy) you may notice spotting of blood in your stool or on the toilet paper. If you underwent a bowel prep for your procedure, you may not have a normal bowel movement for a few days.  Please Note:  You might notice some irritation and congestion in your nose or some drainage.  This is from the oxygen used during your procedure.  There is no need for concern and it should clear up in a day or so.  SYMPTOMS TO REPORT IMMEDIATELY:   Following lower endoscopy (colonoscopy or flexible sigmoidoscopy):  Excessive amounts of blood in the stool  Significant tenderness or  worsening of abdominal pains  Swelling of the abdomen that is new, acute  Fever of 100F or higher  For urgent or emergent issues, a gastroenterologist can be reached at any hour by calling [phone]. Do not use MyChart messaging for urgent concerns.   DIET:  We do recommend a small meal at first, but then you may proceed to your regular diet.  Drink plenty of fluids but you should avoid alcoholic beverages for 24 hours.  ACTIVITY:  You should plan to take it easy for the rest of today and you should NOT DRIVE or use heavy machinery until tomorrow (because of the sedation medicines used during the test).    FOLLOW UP: Our staff will call the number listed on your records 48-72 hours following your procedure to check on you and address any questions or concerns that you may have regarding the information given to you following your procedure. If we do not reach you, we will leave a message.  We will attempt to reach you two times.  During this call, we will ask if you have developed any symptoms of COVID 19. If you develop any symptoms (ie: fever, flu-like symptoms, shortness of breath, cough etc.) before then, please call [phone].  If you test positive for Covid 19 in the 2 weeks post procedure, please call and report this information to [place].    If any biopsies were taken you will be contacted by phone or by letter within the  next 1-3 weeks.  Please call us at [phone] if you have not heard about the biopsies in 3 weeks.   SIGNATURES/CONFIDENTIALITY: You and/or your care partner have signed paperwork which will be entered into your electronic medical record.  These signatures attest to the fact that that the information above on your After Visit Summary has been reviewed and is understood.  Full responsibility of the confidentiality of this discharge information lies with you and/or your care-partner.

## 2020-09-22 NOTE — Progress Notes (Signed)
   Kyle [name] 50 y.o. [birth year] [id number]  Assessment & Plan:      Subjective:   Chief Complaint:  HPI  No Known Allergies Current Meds  Medication Sig  . Multiple Vitamin (MULTI-VITAMINS) TABS Take 1 tablet by mouth daily.    Current Facility-Administered Medications for the 09/22/20 encounter (Procedure visit) with Gatha Mayer, MD  Medication  . 0.9 %  sodium chloride infusion   Past Medical History:  Diagnosis Date  . 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 GERD (gastroesophageal reflux disease)    Past Surgical History:  Procedure Laterality Date  . left ACL  1990  . surgery for shattered right patella  1986   Social History   Social History Narrative  . Not on file   family history includes Alcohol abuse in an other family member; Arthritis in an other family member; Atrial fibrillation in his father; Coronary artery disease in his father; Diabetes in his father and another family member; Heart disease in an other family member; Hyperlipidemia in an other family member; Hypertension in an other family member; Hypothyroidism in his mother; Stroke in an other family member; Sudden death in an other family member.   Review of Systems   Objective:   Physical Exam

## 2020-09-22 NOTE — Op Note (Signed)
[facility] Patient Name: Kyle [name] Procedure Date: 09/22/2020 8:17 AM MRN: [id number] Endoscopist: Gatha Mayer , MD Age: 50 Referring MD:  Date of Birth: 1970-09-12 Gender: Male Account #: [id number] Procedure:                Colonoscopy Indications:              Screening for colorectal malignant neoplasm, This                            is the patient's first colonoscopy Medicines:                Propofol per Anesthesia, Monitored Anesthesia Care Procedure:                Pre-Anesthesia Assessment:                           - Prior to the procedure, a History and Physical                            was performed, and patient medications and                            allergies were reviewed. The patient's tolerance of                            previous anesthesia was also reviewed. The risks                            and benefits of the procedure and the sedation                            options and risks were discussed with the patient.                            All questions were answered, and informed consent                            was obtained. Prior Anticoagulants: The patient has                            taken no previous anticoagulant or antiplatelet                            agents. ASA Grade Assessment: II - A patient with                            mild systemic disease. After reviewing the risks                            and benefits, the patient was deemed in                            satisfactory condition to undergo the procedure.  After obtaining informed consent, the colonoscope                            was passed under direct vision. Throughout the                            procedure, the patient's blood pressure, pulse, and                            oxygen saturations were monitored continuously. The                            Olympus CF-HQ190L [id number]) Colonoscope was                             introduced through the anus and advanced to the the                            cecum, identified by appendiceal orifice and                            ileocecal valve. The colonoscopy was performed                            without difficulty. The patient tolerated the                            procedure well. The quality of the bowel                            preparation was good. The ileocecal valve,                            appendiceal orifice, and rectum were photographed.                            The bowel preparation used was Miralax via split                            dose instruction. Scope In: 8:29:20 AM Scope Out: 8:42:05 AM Scope Withdrawal Time: 0 hours 9 minutes 59 seconds  Total Procedure Duration: 0 hours 12 minutes 45 seconds  Findings:                 The perianal and digital rectal examinations were                            normal. Pertinent negatives include normal prostate                            (size, shape, and consistency).                           A diminutive polyp was found in the distal rectum.  The polyp was sessile. The polyp was removed with a                            cold snare. Resection and retrieval were complete.                            Verification of patient identification for the                            specimen was done. Estimated blood loss was minimal.                           Multiple small and large-mouthed diverticula were                            found in the sigmoid colon.                           The exam was otherwise without abnormality on                            direct and retroflexion views. Complications:            No immediate complications. Estimated Blood Loss:     Estimated blood loss was minimal. Impression:               - One diminutive polyp in the distal rectum,                            removed with a cold snare. Resected and retrieved.                           -  Moderate diverticulosis in the sigmoid colon.                           - The examination was otherwise normal on direct                            and retroflexion views. Recommendation:           - Patient has a contact number available for                            emergencies. The signs and symptoms of potential                            delayed complications were discussed with the                            patient. Return to normal activities tomorrow.                            Written discharge instructions were provided to the  patient.                           - Resume previous diet.                           - Continue present medications.                           - Await pathology results.                           - Repeat colonoscopy is recommended. The                            colonoscopy date will be determined after pathology                            results from today's exam become available for                            review. Gatha Mayer, MD 09/22/2020 8:52:49 AM This report has been signed electronically.

## 2020-09-22 NOTE — Progress Notes (Signed)
Right at end of procedure, 0842, pt starts agressively retching.  Suctioning started with no results.  Pt allowed to wake up in procedure room before PACU.  BS clear in PACU VSS

## 2020-09-22 NOTE — Progress Notes (Signed)
Called to room to assist during endoscopic procedure.  Patient ID and intended procedure confirmed with present staff. Received instructions for my participation in the procedure from the performing physician.  

## 2020-09-26 ENCOUNTER — Telehealth: Payer: Self-pay | Admitting: *Deleted

## 2020-09-26 NOTE — Telephone Encounter (Signed)
  Follow up Call-  Call back number 09/22/2020  Post procedure Call Back phone  # [phone]  Permission to leave phone message Yes  Some recent data might be hidden     Patient questions:  Do you have a fever, pain , or abdominal swelling? No. Pain Score  0 *  Have you tolerated food without any problems? Yes.    Have you been able to return to your normal activities? Yes.    Do you have any questions about your discharge instructions: Diet   No. Medications  No. Follow up visit  No.  Do you have questions or concerns about your Care? No.  Actions: * If pain score is 4 or above: 1. No action needed, pain <4.Have you developed a fever since your procedure? no  2.   Have you had an respiratory symptoms (SOB or cough) since your procedure? no  3.   Have you tested positive for COVID 19 since your procedure no  4.   Have you had any family members/close contacts diagnosed with the COVID 19 since your procedure?  no   If yes to any of these questions please route to Joylene John, RN and Joella Prince, RN

## 2020-10-04 ENCOUNTER — Encounter: Payer: Self-pay | Admitting: Internal Medicine

## 2020-10-05 ENCOUNTER — Telehealth: Payer: Self-pay

## 2020-10-05 NOTE — Telephone Encounter (Signed)
LVM advising to call back and get scheduled for a new patient appointment.   Per Dr.Parker:I am ok with double booking new patients on my schedule if needed or we can add him on to my schedule in June.

## 2020-10-27 ENCOUNTER — Ambulatory Visit: Payer: BC Managed Care – PPO | Admitting: Family Medicine

## 2020-10-27 ENCOUNTER — Encounter: Payer: Self-pay | Admitting: Family Medicine

## 2020-10-27 ENCOUNTER — Other Ambulatory Visit: Payer: Self-pay

## 2020-10-27 VITALS — BP 118/68 | HR 62 | Temp 98.0°F | Ht 70.0 in | Wt 243.2 lb

## 2020-10-27 DIAGNOSIS — E785 Hyperlipidemia, unspecified: Secondary | ICD-10-CM | POA: Diagnosis not present

## 2020-10-27 DIAGNOSIS — Z125 Encounter for screening for malignant neoplasm of prostate: Secondary | ICD-10-CM

## 2020-10-27 DIAGNOSIS — R739 Hyperglycemia, unspecified: Secondary | ICD-10-CM | POA: Insufficient documentation

## 2020-10-27 DIAGNOSIS — E669 Obesity, unspecified: Secondary | ICD-10-CM

## 2020-10-27 DIAGNOSIS — Z0001 Encounter for general adult medical examination with abnormal findings: Secondary | ICD-10-CM | POA: Diagnosis not present

## 2020-10-27 DIAGNOSIS — K219 Gastro-esophageal reflux disease without esophagitis: Secondary | ICD-10-CM | POA: Insufficient documentation

## 2020-10-27 DIAGNOSIS — Z859 Personal history of malignant neoplasm, unspecified: Secondary | ICD-10-CM

## 2020-10-27 DIAGNOSIS — Z6834 Body mass index (BMI) 34.0-34.9, adult: Secondary | ICD-10-CM

## 2020-10-27 LAB — COMPREHENSIVE METABOLIC PANEL
ALT: 33 U/L (ref 0–53)
AST: 27 U/L (ref 0–37)
Albumin: 4.6 g/dL (ref 3.5–5.2)
Alkaline Phosphatase: 54 U/L (ref 39–117)
BUN: 19 mg/dL (ref 6–23)
CO2: 31 mEq/L (ref 19–32)
Calcium: 9.5 mg/dL (ref 8.4–10.5)
Chloride: 103 mEq/L (ref 96–112)
Creatinine, Ser: 0.99 mg/dL (ref 0.40–1.50)
GFR: 88.06 mL/min (ref >60.00)
Glucose, Bld: 101 mg/dL — ABNORMAL HIGH (ref 70–99)
Potassium: 4.3 mEq/L (ref 3.5–5.1)
Sodium: 140 mEq/L (ref 135–145)
Total Bilirubin: 1 mg/dL (ref 0.2–1.2)
Total Protein: 6.9 g/dL (ref 6.0–8.3)

## 2020-10-27 LAB — CBC
HCT: 42.3 % (ref 39.0–52.0)
Hemoglobin: 14.4 g/dL (ref 13.0–17.0)
MCHC: 34.1 g/dL (ref 30.0–36.0)
MCV: 89 fl (ref 78.0–100.0)
Platelets: 209 10*3/uL (ref 150.0–400.0)
RBC: 4.75 Mil/uL (ref 4.22–5.81)
RDW: 13.3 % (ref 11.5–15.5)
WBC: 4.5 10*3/uL (ref 4.0–10.5)

## 2020-10-27 LAB — LIPID PANEL
Cholesterol: 199 mg/dL (ref 0–200)
HDL: 50.9 mg/dL (ref >39.00)
LDL Cholesterol: 134 mg/dL — ABNORMAL HIGH (ref 0–99)
NonHDL: 148.28
Total CHOL/HDL Ratio: 4
Triglycerides: 72 mg/dL (ref 0.0–149.0)
VLDL: 14.4 mg/dL (ref 0.0–40.0)

## 2020-10-27 LAB — TSH: TSH: 1.82 u[IU]/mL (ref 0.35–4.50)

## 2020-10-27 LAB — HEMOGLOBIN A1C: Hgb A1c MFr Bld: 5.5 % (ref 4.6–6.5)

## 2020-10-27 LAB — PSA: PSA: 0.4 ng/mL (ref 0.10–4.00)

## 2020-10-27 NOTE — Assessment & Plan Note (Signed)
In remission.  Follows with oncology at [facility].

## 2020-10-27 NOTE — Patient Instructions (Signed)
It was very nice to see you today!  We will check blood work today.  We will also check a cardiac CT scan to look for any calcifications in your coronary arteries.  Please continue working on diet and exercise.  I will see you back in a year.  Please come back to see me sooner if needed.  Take care, Dr Jerline Pain  PLEASE NOTE:  If you had any lab tests please let us know if you have not heard back within a few days. You may see your results on mychart before we have a chance to review them but we will give you a call once they are reviewed by [place]. If we ordered any referrals today, please let us know if you have not heard from their office within the next week.   Please try these tips to maintain a healthy lifestyle:   Eat at least 3 REAL meals and 1-2 snacks per day.  Aim for no more than 5 hours between eating.  If you eat breakfast, please do so within one hour of getting up.    Each meal should contain half fruits/vegetables, one quarter protein, and one quarter carbs (no bigger than a computer mouse)   Cut down on sweet beverages. This includes juice, soda, and sweet tea.     Drink at least 1 glass of water with each meal and aim for at least 8 glasses per day   Exercise at least 150 minutes every week.    Preventive Care 28-93 Years Old, Male Preventive care refers to lifestyle choices and visits with your health care provider that can promote health and wellness. This includes:  A yearly physical exam. This is also called an annual wellness visit.  Regular dental and eye exams.  Immunizations.  Screening for certain conditions.  Healthy lifestyle choices, such as: ? Eating a healthy diet. ? Getting regular exercise. ? Not using drugs or products that contain nicotine and tobacco. ? Limiting alcohol use. What can I expect for my preventive care visit? Physical exam Your health care provider will check your:  Height and weight. These may be used to calculate your  BMI (body mass index). BMI is a measurement that tells if you are at a healthy weight.  Heart rate and blood pressure.  Body temperature.  Skin for abnormal spots. Counseling Your health care provider may ask you questions about your:  Past medical problems.  Family's medical history.  Alcohol, tobacco, and drug use.  Emotional well-being.  Home life and relationship well-being.  Sexual activity.  Diet, exercise, and sleep habits.  Work and work Statistician.  Access to firearms. What immunizations do I need? Vaccines are usually given at various ages, according to a schedule. Your health care provider will recommend vaccines for you based on your age, medical history, and lifestyle or other factors, such as travel or where you work.   What tests do I need? Blood tests  Lipid and cholesterol levels. These may be checked every 5 years, or more often if you are over 23 years old.  Hepatitis C test.  Hepatitis B test. Screening  Lung cancer screening. You may have this screening every year starting at age 22 if you have a 30-pack-year history of smoking and currently smoke or have quit within the past 15 years.  Prostate cancer screening. Recommendations will vary depending on your family history and other risks.  Genital exam to check for testicular cancer or hernias.  Colorectal cancer screening. ?  All adults should have this screening starting at age 77 and continuing until age 13. ? Your health care provider may recommend screening at age 57 if you are at increased risk. ? You will have tests every 1-10 years, depending on your results and the type of screening test.  Diabetes screening. ? This is done by checking your blood sugar (glucose) after you have not eaten for a while (fasting). ? You may have this done every 1-3 years.  STD (sexually transmitted disease) testing, if you are at risk. Follow these instructions at home: Eating and drinking  Eat a diet  that includes fresh fruits and vegetables, whole grains, lean protein, and low-fat dairy products.  Take vitamin and mineral supplements as recommended by your health care provider.  Do not drink alcohol if your health care provider tells you not to drink.  If you drink alcohol: ? Limit how much you have to 0-2 drinks a day. ? Be aware of how much alcohol is in your drink. In the [place] one drink equals one 12 oz bottle of beer (355 mL), one 5 oz glass of wine (148 mL), or one 1 oz glass of hard liquor (44 mL).   Lifestyle  Take daily care of your teeth and gums. Brush your teeth every morning and night with fluoride toothpaste. Floss one time each day.  Stay active. Exercise for at least 30 minutes 5 or more days each week.  Do not use any products that contain nicotine or tobacco, such as cigarettes, e-cigarettes, and chewing tobacco. If you need help quitting, ask your health care provider.  Do not use drugs.  If you are sexually active, practice safe sex. Use a condom or other form of protection to prevent STIs (sexually transmitted infections).  If told by your health care provider, take low-dose aspirin daily starting at age 31.  Find healthy ways to cope with stress, such as: ? Meditation, yoga, or listening to music. ? Journaling. ? Talking to a trusted person. ? Spending time with friends and family. Safety  Always wear your seat belt while driving or riding in a vehicle.  Do not drive: ? If you have been drinking alcohol. Do not ride with someone who has been drinking. ? When you are tired or distracted. ? While texting.  Wear a helmet and other protective equipment during sports activities.  If you have firearms in your house, make sure you follow all gun safety procedures. What's next?  Go to your health care provider once a year for an annual wellness visit.  Ask your health care provider how often you should have your eyes and teeth checked.  Stay up to  date on all vaccines. This information is not intended to replace advice given to you by your health care provider. Make sure you discuss any questions you have with your health care provider. Document Revised: 03/03/2019 Document Reviewed: 05/29/2018 Elsevier Patient Education  2021 [employer].

## 2020-10-27 NOTE — Assessment & Plan Note (Signed)
Check A1c. 

## 2020-10-27 NOTE — Assessment & Plan Note (Signed)
Check lipids 

## 2020-10-27 NOTE — Assessment & Plan Note (Signed)
Stable and managed by lifestyle interventions.

## 2020-10-27 NOTE — Progress Notes (Signed)
Chief Complaint:  Kyle [name] is a 50 y.o. male who presents today for his annual comprehensive physical exam.    Assessment/Plan:  Chronic Problems Addressed Today: Dyslipidemia Check lipids.   Hyperglycemia Check A1c.  History of tonsillar cancer In remission.  Follows with oncology at [facility].  GERD (gastroesophageal reflux disease) Stable and managed by lifestyle interventions.   Body mass index is 34.9 kg/m. / Obese  BMI Metric Follow Up - 10/27/20 1223      BMI Metric Follow Up-Please document annually   BMI Metric Follow Up Education provided            Preventative Healthcare: Check labs.  He is up-to-date on colon cancer screening.  We will check screening cardiac CT to evaluate for coronary artery disease given family history.  Up-to-date on vaccines.  Patient Counseling(The following topics were reviewed and/or handout was given):  -Nutrition: Stressed importance of moderation in sodium/caffeine intake, saturated fat and cholesterol, caloric balance, sufficient intake of fresh fruits, vegetables, and fiber.  -Stressed the importance of regular exercise.   -Substance Abuse: Discussed cessation/primary prevention of tobacco, alcohol, or other drug use; driving or other dangerous activities under the influence; availability of treatment for abuse.   -Injury prevention: Discussed safety belts, safety helmets, smoke detector, smoking near bedding or upholstery.   -Sexuality: Discussed sexually transmitted diseases, partner selection, use of condoms, avoidance of unintended pregnancy and contraceptive alternatives.   -Dental health: Discussed importance of regular tooth brushing, flossing, and dental visits.  -Health maintenance and immunizations reviewed. Please refer to Health maintenance section.  Return to care in 1 year for next preventative visit.     Subjective:  HPI:  He has no acute complaints today.   Lifestyle Diet: Balanced.  Exercise:  Limited. Does a lot of work around the house.   Depression screen PHQ 2/9 10/27/2020  Decreased Interest 0  Down, Depressed, Hopeless 0  PHQ - 2 Score 0    Health Maintenance Due  Topic Date Due  . Hepatitis C Screening  Never done     ROS: Per HPI, otherwise a complete review of systems was negative.   PMH:  The following were reviewed and entered/updated in epic: Past Medical History:  Diagnosis Date  . GERD (gastroesophageal reflux disease)   . 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 Dyslipidemia 10/27/2020  . Hyperglycemia 10/27/2020  . GERD (gastroesophageal reflux disease) 10/27/2020  . History of tonsillar cancer 07/11/2015  . Allergic rhinitis 03/01/2014   Past Surgical History:  Procedure Laterality Date  . ANTERIOR CRUCIATE LIGAMENT REPAIR Left 1990  . ORIF PATELLA FRACTURE  1986    Family History  Problem Relation Age of Onset  . Hypothyroidism Mother   . Coronary artery disease Father        status post CABG, carotid endarterectomy, ablation for atrial fib  . Diabetes Father   . Atrial fibrillation Father   . Alcohol abuse Other   . Arthritis Other   . Diabetes Other   . Hyperlipidemia Other   . Hypertension Other   . Stroke Other   . Sudden death Other   . Heart disease Other   . Colon cancer Neg Hx   . Esophageal cancer Neg Hx   . Stomach cancer Neg Hx   . Rectal cancer Neg Hx     Medications- reviewed and updated Current Outpatient Medications  Medication Sig Dispense Refill  .  Multiple Vitamin (MULTI-VITAMINS) TABS Take 1 tablet by mouth daily.      No current facility-administered medications for this visit.    Allergies-reviewed and updated No Known Allergies  Social History   Socioeconomic History  . Marital status: Married    Spouse name: Not on file  . Number of children: Not on file  . Years of education: Not on file  . Highest education level: Not on  file  Occupational History    Employer: [employer]  Tobacco Use  . Smoking status: Never Smoker  . Smokeless tobacco: Never Used  Vaping Use  . Vaping Use: Never used  Substance and Sexual Activity  . Alcohol use: Yes    Alcohol/week: 3.0 standard drinks    Types: 3 drink(s) per week  . Drug use: No  . Sexual activity: Not on file  Other Topics Concern  . Not on file  Social History Narrative  . Not on file   Social Determinants of Health   Financial Resource Strain: Not on file  Food Insecurity: Not on file  Transportation Needs: Not on file  Physical Activity: Not on file  Stress: Not on file  Social Connections: Not on file        Objective:  Physical Exam: BP 118/68   Pulse 62   Temp 98 F (36.7 C)   Ht 5\' 10"  (1.778 m)   Wt 243 lb 3.2 oz (110.3 kg)   SpO2 98%   BMI 34.90 kg/m   Body mass index is 34.9 kg/m. Wt Readings from Last 3 Encounters:  10/27/20 243 lb 3.2 oz (110.3 kg)  09/22/20 250 lb (113.4 kg)  09/06/20 250 lb (113.4 kg)   Gen: NAD, resting comfortably HEENT: TMs normal bilaterally. OP clear. No thyromegaly noted.  CV: RRR with no murmurs appreciated Pulm: NWOB, CTAB with no crackles, wheezes, or rhonchi GI: Normal bowel sounds present. Soft, Nontender, Nondistended. MSK: no edema, cyanosis, or clubbing noted Skin: warm, dry Neuro: CN2-12 grossly intact. Strength 5/5 in upper and lower extremities. Reflexes symmetric and intact bilaterally.  Psych: Normal affect and thought content     Christinia Lambeth M. Jerline Pain, MD 10/27/2020 12:24 PM

## 2020-10-28 NOTE — Progress Notes (Signed)
Please inform patient of the following:  His bad cholesterol was just mildly elevated but everything else was normal.  Do not need to make any changes to his treatment plan at this time.  Would like for him to continue to work on diet and exercise.  We can recheck in a year.

## 2020-12-06 ENCOUNTER — Ambulatory Visit (INDEPENDENT_AMBULATORY_CARE_PROVIDER_SITE_OTHER)
Admission: RE | Admit: 2020-12-06 | Discharge: 2020-12-06 | Disposition: A | Discharge status: Home / Self Care | Payer: Self-pay | Source: Ambulatory Visit | Attending: Family Medicine | Admitting: Family Medicine

## 2020-12-06 ENCOUNTER — Other Ambulatory Visit: Payer: Self-pay

## 2020-12-06 DIAGNOSIS — E785 Hyperlipidemia, unspecified: Secondary | ICD-10-CM

## 2020-12-07 NOTE — Progress Notes (Signed)
Please inform patient of the following:  Good news! CT calcium score is 0. This means he has no calcium build up in the arteries in his heart. We can check again in 5-10 years.  Kyle [name]. Jerline Pain, MD 12/07/2020 8:05 AM

## 2021-03-21 ENCOUNTER — Encounter: Payer: Self-pay | Admitting: Family Medicine

## 2021-03-22 ENCOUNTER — Other Ambulatory Visit: Payer: Self-pay

## 2021-03-23 ENCOUNTER — Other Ambulatory Visit: Payer: Self-pay

## 2021-03-23 MED ORDER — TADALAFIL 20 MG PO TABS: ORAL_TABLET | ORAL | 11 refills | Status: DC

## 2021-03-23 NOTE — Telephone Encounter (Signed)
Patient called in a would like this sent to [facility] # [place], [place] asap patient is leaving out of country in the morning

## 2021-04-24 DIAGNOSIS — H903 Sensorineural hearing loss, bilateral: Secondary | ICD-10-CM | POA: Diagnosis not present

## 2021-04-24 DIAGNOSIS — H6982 Other specified disorders of Eustachian tube, left ear: Secondary | ICD-10-CM | POA: Diagnosis not present

## 2021-06-02 DIAGNOSIS — C099 Malignant neoplasm of tonsil, unspecified: Secondary | ICD-10-CM | POA: Diagnosis not present

## 2021-06-02 DIAGNOSIS — Z85819 Personal history of malignant neoplasm of unspecified site of lip, oral cavity, and pharynx: Secondary | ICD-10-CM | POA: Diagnosis not present

## 2021-06-02 DIAGNOSIS — K219 Gastro-esophageal reflux disease without esophagitis: Secondary | ICD-10-CM | POA: Diagnosis not present

## 2021-06-02 DIAGNOSIS — Z08 Encounter for follow-up examination after completed treatment for malignant neoplasm: Secondary | ICD-10-CM | POA: Diagnosis not present

## 2021-06-02 DIAGNOSIS — H6982 Other specified disorders of Eustachian tube, left ear: Secondary | ICD-10-CM | POA: Diagnosis not present

## 2021-06-02 DIAGNOSIS — C770 Secondary and unspecified malignant neoplasm of lymph nodes of head, face and neck: Secondary | ICD-10-CM | POA: Diagnosis not present

## 2021-08-24 ENCOUNTER — Encounter: Payer: Self-pay | Admitting: Family Medicine

## 2021-08-24 ENCOUNTER — Ambulatory Visit: Payer: No Typology Code available for payment source | Admitting: Family Medicine

## 2021-08-24 VITALS — BP 141/59 | HR 67 | Temp 98.5°F | Ht 70.0 in | Wt 237.4 lb

## 2021-08-24 DIAGNOSIS — Z23 Encounter for immunization: Secondary | ICD-10-CM | POA: Diagnosis not present

## 2021-08-24 DIAGNOSIS — J309 Allergic rhinitis, unspecified: Secondary | ICD-10-CM

## 2021-08-24 MED ORDER — OLOPATADINE HCL 0.7 % OP SOLN: OPHTHALMIC | 0 refills | Status: DC

## 2021-08-24 MED ORDER — AZITHROMYCIN 250 MG PO TABS: ORAL_TABLET | ORAL | 0 refills | Status: DC

## 2021-08-24 NOTE — Patient Instructions (Signed)
It was very nice to see you today! ? ?I am concerned that you may have a sinus infection.  Please start the azithromycin.  Please try the Pataday drops. ? ?We will give your shingles vaccine today. ? ?Let me know if not improving by next week. ? ?Take care, ?Dr Jerline Pain ? ?PLEASE NOTE: ? ?If you had any lab tests please let us know if you have not heard back within a few days. You may see your results on mychart before we have a chance to review them but we will give you a call once they are reviewed by [place]. If we ordered any referrals today, please let us know if you have not heard from their office within the next week.  ? ?Please try these tips to maintain a healthy lifestyle: ? ?Eat at least 3 REAL meals and 1-2 snacks per day.  Aim for no more than 5 hours between eating.  If you eat breakfast, please do so within one hour of getting up.  ? ?Each meal should contain half fruits/vegetables, one quarter protein, and one quarter carbs (no bigger than a computer mouse) ? ?Cut down on sweet beverages. This includes juice, soda, and sweet tea.  ? ?Drink at least 1 glass of water with each meal and aim for at least 8 glasses per day ? ?Exercise at least 150 minutes every week.   ?

## 2021-08-24 NOTE — Assessment & Plan Note (Signed)
Worsened recently due to pollen outbreak.  Could be contributing to above conjunctivitis and sinusitis.  He can continue over-the-counter allergy medications.  May benefit from Astelin nasal spray if this continues to be an issue.  ?

## 2021-08-24 NOTE — Progress Notes (Signed)
? ?  Kyle [name] is a 51 y.o. male who presents today for an office visit. ? ?Assessment/Plan:  ?New/Acute Problems: ?Conjunctivitis ?Appears to be viral versus allergic.  Does have a history of allergic rhinitis.  We will start Pataday drops.  We will be starting azithromycin as below for his sinus infection which should help treat any underlying bacterial infection as well.  We discussed reasons to return to care. ? ?Sinusitis ?Given length of symptoms will start azithromycin.  He can continue over-the-counter meds.  Encouraged hydration.  Discussed reasons to return to care.  He will let me know if not improving. ? ?Chronic Problems Addressed Today: ?Allergic rhinitis ?Worsened recently due to pollen outbreak.  Could be contributing to above conjunctivitis and sinusitis.  He can continue over-the-counter allergy medications.  May benefit from Astelin nasal spray if this continues to be an issue.  ? ?Shingles vaccine given today. ? ?  ?Subjective:  ?HPI: ? ?Patient here with nasal congestion for the past few weeks. He has been coughing up some mucus. This is worse in the mornings. Symptoms seems to improve throughout the day. Symptoms has been getting better overall. Throat is very dry at night. He has tried saline drops with some relief. No fever or chills. No reported nausea or vomiting. ? ?He is also concern about possible conjunctivitis.. Located on both eyes. This started a couple of days ago. He has noticed severe redness, swelling and lots of discharge. He has tried Human resources officer with some improvement. He has taken one dose this morning. He notes he woke up with crusty eye yesterday. He was unable to open his eye due to this. No fever or chills. No pain. No other sick contacts. ? ?   ?  ?Objective:  ?Physical Exam: ?BP (!) 141/59 (BP Location: Left Arm)   Pulse 67   Temp 98.5 ?F (36.9 ?C) (Temporal)   Ht '5\' 10"'$  (1.778 m)   Wt 237 lb 6.4 oz (107.7 kg)   SpO2 96%   BMI 34.06 kg/m?   ?Gen: No acute  distress, resting comfortably ?HEENT: ?-Bilateral conjunctival erythema noted.  Scant watery discharge.  Extraocular eye movements intact without significant pain.  Visual acuity grossly intact ?-Bilateral TM with clear effusion.  Nasal mucosa erythematous and boggy bilaterally.  OP erythematous without exudate. ?CV: Regular rate and rhythm with no murmurs appreciated ?Pulm: Normal work of breathing, clear to auscultation bilaterally with no crackles, wheezes, or rhonchi ?Neuro: Grossly normal, moves all extremities ?Psych: Normal affect and thought content ? ?   ? ? ?I,Savera Zaman,acting as a Education administrator for Dimas Chyle, MD.,have documented all relevant documentation on the behalf of Dimas Chyle, MD,as directed by  Dimas Chyle, MD while in the presence of Dimas Chyle, MD.  ? ?I, Dimas Chyle, MD, have reviewed all documentation for this visit. The documentation on 08/24/21 for the exam, diagnosis, procedures, and orders are all accurate and complete. ? ?Algis Greenhouse. Jerline Pain, MD ?08/24/2021 3:05 PM  ? ?

## 2021-08-24 NOTE — Addendum Note (Signed)
Addended by: Betti Cruz on: 08/24/2021 03:12 PM ? ? Modules accepted: Orders ? ?

## 2021-10-25 ENCOUNTER — Ambulatory Visit: Payer: No Typology Code available for payment source

## 2021-10-31 ENCOUNTER — Ambulatory Visit: Payer: No Typology Code available for payment source

## 2021-11-01 ENCOUNTER — Ambulatory Visit (INDEPENDENT_AMBULATORY_CARE_PROVIDER_SITE_OTHER): Payer: No Typology Code available for payment source

## 2021-11-01 DIAGNOSIS — Z23 Encounter for immunization: Secondary | ICD-10-CM | POA: Diagnosis not present

## 2022-03-12 ENCOUNTER — Encounter: Payer: Self-pay | Admitting: *Deleted

## 2022-05-01 ENCOUNTER — Encounter: Payer: No Typology Code available for payment source | Admitting: Family Medicine

## 2022-05-01 DIAGNOSIS — J060 Acute laryngopharyngitis: Secondary | ICD-10-CM | POA: Diagnosis not present

## 2022-05-06 DIAGNOSIS — J018 Other acute sinusitis: Secondary | ICD-10-CM | POA: Diagnosis not present

## 2022-05-09 ENCOUNTER — Encounter: Payer: Self-pay | Admitting: Family Medicine

## 2022-05-09 ENCOUNTER — Ambulatory Visit (INDEPENDENT_AMBULATORY_CARE_PROVIDER_SITE_OTHER): Payer: No Typology Code available for payment source | Admitting: Family Medicine

## 2022-05-09 VITALS — BP 128/80 | HR 63 | Temp 98.2°F | Resp 16 | Ht 70.0 in | Wt 242.6 lb

## 2022-05-09 DIAGNOSIS — N529 Male erectile dysfunction, unspecified: Secondary | ICD-10-CM | POA: Diagnosis not present

## 2022-05-09 DIAGNOSIS — Z125 Encounter for screening for malignant neoplasm of prostate: Secondary | ICD-10-CM | POA: Diagnosis not present

## 2022-05-09 DIAGNOSIS — Z0001 Encounter for general adult medical examination with abnormal findings: Secondary | ICD-10-CM | POA: Diagnosis not present

## 2022-05-09 DIAGNOSIS — E785 Hyperlipidemia, unspecified: Secondary | ICD-10-CM | POA: Diagnosis not present

## 2022-05-09 DIAGNOSIS — Z23 Encounter for immunization: Secondary | ICD-10-CM | POA: Diagnosis not present

## 2022-05-09 DIAGNOSIS — R739 Hyperglycemia, unspecified: Secondary | ICD-10-CM | POA: Diagnosis not present

## 2022-05-09 MED ORDER — TADALAFIL 20 MG PO TABS: ORAL_TABLET | ORAL | 11 refills | Status: DC

## 2022-05-09 MED ORDER — AZELASTINE HCL 0.1 % NA SOLN: 2.0000 | Freq: Two times a day (BID) | NASAL | 12 refills | Status: DC

## 2022-05-09 MED ORDER — BENZONATATE 200 MG PO CAPS: 200.0000 mg | ORAL_CAPSULE | Freq: Two times a day (BID) | ORAL | 0 refills | Status: DC | PRN

## 2022-05-09 NOTE — Patient Instructions (Signed)
It was very nice to see you today!  We will check blood work today.  I think you probably have a sinus infection.  Please start the Augmentin.  Please also use the nasal spray and cough medication as needed.  Please keep up the great work with your diet and exercise.  We will give your flu shot today.  Please come back to see [place] in 1 year for your next physical.  Come back sooner if needed.  Take care, Dr Jerline Pain  PLEASE NOTE:  If you had any lab tests please let us know if you have not heard back within a few days. You may see your results on mychart before we have a chance to review them but we will give you a call once they are reviewed by [place]. If we ordered any referrals today, please let us know if you have not heard from their office within the next week.   Please try these tips to maintain a healthy lifestyle:  Eat at least 3 REAL meals and 1-2 snacks per day.  Aim for no more than 5 hours between eating.  If you eat breakfast, please do so within one hour of getting up.   Each meal should contain half fruits/vegetables, one quarter protein, and one quarter carbs (no bigger than a computer mouse)  Cut down on sweet beverages. This includes juice, soda, and sweet tea.   Drink at least 1 glass of water with each meal and aim for at least 8 glasses per day  Exercise at least 150 minutes every week.    Preventive Care 87-66 Years Old, Male Preventive care refers to lifestyle choices and visits with your health care provider that can promote health and wellness. Preventive care visits are also called wellness exams. What can I expect for my preventive care visit? Counseling During your preventive care visit, your health care provider may ask about your: Medical history, including: Past medical problems. Family medical history. Current health, including: Emotional well-being. Home life and relationship well-being. Sexual activity. Lifestyle, including: Alcohol, nicotine or  tobacco, and drug use. Access to firearms. Diet, exercise, and sleep habits. Safety issues such as seatbelt and bike helmet use. Sunscreen use. Work and work Statistician. Physical exam Your health care provider will check your: Height and weight. These may be used to calculate your BMI (body mass index). BMI is a measurement that tells if you are at a healthy weight. Waist circumference. This measures the distance around your waistline. This measurement also tells if you are at a healthy weight and may help predict your risk of certain diseases, such as type 2 diabetes and high blood pressure. Heart rate and blood pressure. Body temperature. Skin for abnormal spots. What immunizations do I need?  Vaccines are usually given at various ages, according to a schedule. Your health care provider will recommend vaccines for you based on your age, medical history, and lifestyle or other factors, such as travel or where you work. What tests do I need? Screening Your health care provider may recommend screening tests for certain conditions. This may include: Lipid and cholesterol levels. Diabetes screening. This is done by checking your blood sugar (glucose) after you have not eaten for a while (fasting). Hepatitis B test. Hepatitis C test. HIV (human immunodeficiency virus) test. STI (sexually transmitted infection) testing, if you are at risk. Lung cancer screening. Prostate cancer screening. Colorectal cancer screening. Talk with your health care provider about your test results, treatment options, and if  necessary, the need for more tests. Follow these instructions at home: Eating and drinking  Eat a diet that includes fresh fruits and vegetables, whole grains, lean protein, and low-fat dairy products. Take vitamin and mineral supplements as recommended by your health care provider. Do not drink alcohol if your health care provider tells you not to drink. If you drink alcohol: Limit how  much you have to 0-2 drinks a day. Know how much alcohol is in your drink. In the [place] one drink equals one 12 oz bottle of beer (355 mL), one 5 oz glass of wine (148 mL), or one 1 oz glass of hard liquor (44 mL). Lifestyle Brush your teeth every morning and night with fluoride toothpaste. Floss one time each day. Exercise for at least 30 minutes 5 or more days each week. Do not use any products that contain nicotine or tobacco. These products include cigarettes, chewing tobacco, and vaping devices, such as e-cigarettes. If you need help quitting, ask your health care provider. Do not use drugs. If you are sexually active, practice safe sex. Use a condom or other form of protection to prevent STIs. Take aspirin only as told by your health care provider. Make sure that you understand how much to take and what form to take. Work with your health care provider to find out whether it is safe and beneficial for you to take aspirin daily. Find healthy ways to manage stress, such as: Meditation, yoga, or listening to music. Journaling. Talking to a trusted person. Spending time with friends and family. Minimize exposure to UV radiation to reduce your risk of skin cancer. Safety Always wear your seat belt while driving or riding in a vehicle. Do not drive: If you have been drinking alcohol. Do not ride with someone who has been drinking. When you are tired or distracted. While texting. If you have been using any mind-altering substances or drugs. Wear a helmet and other protective equipment during sports activities. If you have firearms in your house, make sure you follow all gun safety procedures. What's next? Go to your health care provider once a year for an annual wellness visit. Ask your health care provider how often you should have your eyes and teeth checked. Stay up to date on all vaccines. This information is not intended to replace advice given to you by your health care provider.  Make sure you discuss any questions you have with your health care provider. Document Revised: 11/30/2020 Document Reviewed: 11/30/2020 Elsevier Patient Education  [place].

## 2022-05-09 NOTE — Assessment & Plan Note (Signed)
Check advanced lipid panel per patient request.  He would like to avoid medications if possible.

## 2022-05-09 NOTE — Assessment & Plan Note (Signed)
Check A1c. 

## 2022-05-09 NOTE — Progress Notes (Signed)
Chief Complaint:  Kyle [name] is a 51 y.o. male who presents today for his annual comprehensive physical exam.    Assessment/Plan:  New/Acute Problems: Sinusitis  No red flags.  He has already been given a prescription for Augmentin but has not yet started.  Advised patient to go ahead and start this given that symptoms have been persistent for the last couple of weeks.  We will also add on Astelin and Tessalon.  Encouraged hydration.  He will let me know if not improving.  Chronic Problems Addressed Today: Hyperglycemia Check A1c.   Dyslipidemia Check advanced lipid panel per patient request.  He would like to avoid medications if possible.  Erectile dysfunction Stable on Cialis 20 mg daily as needed.  Will refill today.  No significant side effects.   Preventative Healthcare: Flu shot today.  Check labs.  Up-to-date on colon cancer screening.  Patient Counseling(The following topics were reviewed and/or handout was given):  -Nutrition: Stressed importance of moderation in sodium/caffeine intake, saturated fat and cholesterol, caloric balance, sufficient intake of fresh fruits, vegetables, and fiber.  -Stressed the importance of regular exercise.   -Substance Abuse: Discussed cessation/primary prevention of tobacco, alcohol, or other drug use; driving or other dangerous activities under the influence; availability of treatment for abuse.   -Injury prevention: Discussed safety belts, safety helmets, smoke detector, smoking near bedding or upholstery.   -Sexuality: Discussed sexually transmitted diseases, partner selection, use of condoms, avoidance of unintended pregnancy and contraceptive alternatives.   -Dental health: Discussed importance of regular tooth brushing, flossing, and dental visits.  -Health maintenance and immunizations reviewed. Please refer to Health maintenance section.  Return to care in 1 year for next preventative visit.     Subjective:   HPI:  He has no acute complaints today.   He has been having nasal congestion and horse voice for the last couple of weeks. Some fever and sweats initially. Some muscle aches. Worse in the morning. Voice is slowly coming back. Still a lot of coughing fits at night. No shortess of breath. No chest pain.   Lifestyle Diet: Balanced. Plenty of fruits and vegetables.  Exercise: Trying to be more active.      05/09/2022    7:37 AM  Depression screen PHQ 2/9  Decreased Interest 0  Down, Depressed, Hopeless 0  PHQ - 2 Score 0    Health Maintenance Due  Topic Date Due   COVID-19 Vaccine (1) Never done   Hepatitis C Screening  Never done     ROS: Per HPI, otherwise a complete review of systems was negative.   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   Patient Active Problem List   Diagnosis Date Noted   Erectile dysfunction 05/09/2022   Dyslipidemia 10/27/2020   Hyperglycemia 10/27/2020   GERD (gastroesophageal reflux disease) 10/27/2020   History of tonsillar cancer 07/11/2015   Allergic rhinitis 03/01/2014   Past Surgical History:  Procedure Laterality Date   ANTERIOR CRUCIATE LIGAMENT REPAIR Left 1990   ORIF PATELLA FRACTURE  1986    Family History  Problem Relation Age of Onset   Hypothyroidism Mother    Coronary artery disease Father        status post CABG, carotid endarterectomy, ablation for atrial fib   Diabetes Father    Atrial fibrillation Father    Alcohol abuse  Other    Arthritis Other    Diabetes Other    Hyperlipidemia Other    Hypertension Other    Stroke Other    Sudden death Other    Heart disease Other    Colon cancer Neg Hx    Esophageal cancer Neg Hx    Stomach cancer Neg Hx    Rectal cancer Neg Hx     Medications- reviewed and updated Current Outpatient Medications  Medication  Sig Dispense Refill   azelastine (ASTELIN) 0.1 % nasal spray Place 2 sprays into both nostrils 2 (two) times daily. 30 mL 12   benzonatate (TESSALON) 200 MG capsule Take 1 capsule (200 mg total) by mouth 2 (two) times daily as needed for cough. 20 capsule 0   Multiple Vitamin (MULTI-VITAMINS) TABS Take 1 tablet by mouth daily.      Olopatadine HCl 0.7 % SOLN Apply 1 drop to each eye daily (Patient not taking: Reported on 05/09/2022) 2.5 mL 0   tadalafil (CIALIS) 20 MG tablet Take 1 tablet every day as needed for erectile dysfunction. 30 tablet 11   No current facility-administered medications for this visit.    Allergies-reviewed and updated No Known Allergies  Social History   Socioeconomic History   Marital status: Married    Spouse name: Not on file   Number of children: Not on file   Years of education: Not on file   Highest education level: Not on file  Occupational History    Employer: [employer].  Tobacco Use   Smoking status: Never   Smokeless tobacco: Never  Vaping Use   Vaping Use: Never used  Substance and Sexual Activity   Alcohol use: Yes    Alcohol/week: 3.0 standard drinks of alcohol    Types: 3 drink(s) per week   Drug use: No   Sexual activity: Not on file  Other Topics Concern   Not on file  Social History Narrative   Not on file   Social Determinants of Health   Financial Resource Strain: Not on file  Food Insecurity: Not on file  Transportation Needs: Not on file  Physical Activity: Not on file  Stress: Not on file  Social Connections: Not on file        Objective:  Physical Exam: BP 128/80   Pulse 63   Temp 98.2 F (36.8 C) (Oral)   Resp 16   Ht '5\' 10"'$  (1.778 m)   Wt 242 lb 9.6 oz (110 kg)   SpO2 92%   BMI 34.81 kg/m   Body mass index is 34.81 kg/m. Wt Readings from Last 3 Encounters:  05/09/22 242 lb 9.6 oz (110 kg)  08/24/21 237 lb 6.4 oz (107.7 kg)  10/27/20 243 lb 3.2 oz (110.3 kg)   Gen: NAD, resting  comfortably HEENT: TMs normal bilaterally. OP clear. No thyromegaly noted.  CV: RRR with no murmurs appreciated Pulm: NWOB, CTAB with no crackles, wheezes, or rhonchi GI: Normal bowel sounds present. Soft, Nontender, Nondistended. MSK: no edema, cyanosis, or clubbing noted Skin: warm, dry Neuro: CN2-12 grossly intact. Strength 5/5 in upper and lower extremities. Reflexes symmetric and intact bilaterally.  Psych: Normal affect and thought content     Jermel Artley M. Jerline Pain, MD 05/09/2022 8:27 AM

## 2022-05-09 NOTE — Assessment & Plan Note (Signed)
Stable on Cialis 20 mg daily as needed.  Will refill today.  No significant side effects.

## 2022-05-14 LAB — HEMOGLOBIN A1C
Hgb A1c MFr Bld: 5.6 % of total Hgb (ref <5.7)
Mean Plasma Glucose: 114 mg/dL
eAG (mmol/L): 6.3 mmol/L

## 2022-05-14 LAB — CARDIO IQ(R) ADVANCED LIPID PANEL
Apolipoprotein B: 91 mg/dL — ABNORMAL HIGH
Cholesterol: 175 mg/dL
HDL LARGE: 9526 nmol/L
HDL: 39 mg/dL — ABNORMAL LOW
LDL Cholesterol (Calc): 115 mg/dL — ABNORMAL HIGH
LDL Medium: 652 nmol/L — ABNORMAL HIGH
LDL Particle Number: 2404 nmol/L — ABNORMAL HIGH
LDL Peak Size: 218.7 Angstrom — ABNORMAL LOW
LDL Small: 426 nmol/L — ABNORMAL HIGH
Lipoprotein (a): <10 nmol/L
Non-HDL Cholesterol (Calc): 136 mg/dL — ABNORMAL HIGH
Total CHOL/HDL Ratio: 4.5 calc
Triglycerides: 100 mg/dL

## 2022-05-14 LAB — CBC
HCT: 40.9 % (ref 38.5–50.0)
Hemoglobin: 14.2 g/dL (ref 13.2–17.1)
MCH: 30.5 pg (ref 27.0–33.0)
MCHC: 34.7 g/dL (ref 32.0–36.0)
MCV: 88 fL (ref 80.0–100.0)
MPV: 9.3 fL (ref 7.5–12.5)
Platelets: 304 Thousand/uL (ref 140–400)
RBC: 4.65 Million/uL (ref 4.20–5.80)
RDW: 11.8 % (ref 11.0–15.0)
WBC: 5 Thousand/uL (ref 3.8–10.8)

## 2022-05-14 LAB — COMPREHENSIVE METABOLIC PANEL
AG Ratio: 1.6 (calc) (ref 1.0–2.5)
ALT: 39 U/L (ref 9–46)
AST: 23 U/L (ref 10–35)
Albumin: 4.2 g/dL (ref 3.6–5.1)
Alkaline phosphatase (APISO): 74 U/L (ref 35–144)
BUN: 16 mg/dL (ref 7–25)
CO2: 26 mmol/L (ref 20–32)
Calcium: 9.2 mg/dL (ref 8.6–10.3)
Chloride: 105 mmol/L (ref 98–110)
Creat: 0.96 mg/dL (ref 0.70–1.30)
Globulin: 2.7 g/dL (calc) (ref 1.9–3.7)
Glucose, Bld: 117 mg/dL — ABNORMAL HIGH (ref 65–99)
Potassium: 4 mmol/L (ref 3.5–5.3)
Sodium: 140 mmol/L (ref 135–146)
Total Bilirubin: 0.3 mg/dL (ref 0.2–1.2)
Total Protein: 6.9 g/dL (ref 6.1–8.1)

## 2022-05-14 LAB — TSH: TSH: 2.73 mIU/L (ref 0.40–4.50)

## 2022-05-14 LAB — PSA: PSA: 0.45 ng/mL (ref <=4.00)

## 2022-05-15 NOTE — Progress Notes (Signed)
Please inform patient of the following:  Cholesterol is borderline.  Do not need to start meds but he should continue to work on diet and exercise including at least 30 minutes of cardio per day.  His blood sugar was borderline elevated but his A1c looks good.  Thing else is normal.  Do not need to make any other adjustments to treatment plan at this time.  We can recheck in a year.

## 2022-06-06 DIAGNOSIS — C099 Malignant neoplasm of tonsil, unspecified: Secondary | ICD-10-CM | POA: Diagnosis not present

## 2022-06-06 DIAGNOSIS — C770 Secondary and unspecified malignant neoplasm of lymph nodes of head, face and neck: Secondary | ICD-10-CM | POA: Diagnosis not present

## 2022-07-09 DIAGNOSIS — S4981XA Other specified injuries of right shoulder and upper arm, initial encounter: Secondary | ICD-10-CM | POA: Diagnosis not present

## 2022-07-09 DIAGNOSIS — M25511 Pain in right shoulder: Secondary | ICD-10-CM | POA: Diagnosis not present

## 2022-07-09 DIAGNOSIS — S43101A Unspecified dislocation of right acromioclavicular joint, initial encounter: Secondary | ICD-10-CM | POA: Diagnosis not present

## 2022-07-17 DIAGNOSIS — S43421A Sprain of right rotator cuff capsule, initial encounter: Secondary | ICD-10-CM | POA: Diagnosis not present

## 2022-07-17 DIAGNOSIS — Y9323 Activity, snow (alpine) (downhill) skiing, snow boarding, sledding, tobogganing and snow tubing: Secondary | ICD-10-CM | POA: Diagnosis not present

## 2022-08-08 ENCOUNTER — Encounter: Payer: Self-pay | Admitting: Family Medicine

## 2022-09-01 DIAGNOSIS — S46011D Strain of muscle(s) and tendon(s) of the rotator cuff of right shoulder, subsequent encounter: Secondary | ICD-10-CM | POA: Diagnosis not present

## 2022-09-01 DIAGNOSIS — M25411 Effusion, right shoulder: Secondary | ICD-10-CM | POA: Diagnosis not present

## 2022-09-01 DIAGNOSIS — S46811A Strain of other muscles, fascia and tendons at shoulder and upper arm level, right arm, initial encounter: Secondary | ICD-10-CM | POA: Diagnosis not present

## 2022-09-01 DIAGNOSIS — Y9323 Activity, snow (alpine) (downhill) skiing, snow boarding, sledding, tobogganing and snow tubing: Secondary | ICD-10-CM | POA: Diagnosis not present

## 2022-09-01 DIAGNOSIS — M2540 Effusion, unspecified joint: Secondary | ICD-10-CM | POA: Diagnosis not present

## 2022-09-01 DIAGNOSIS — S46011A Strain of muscle(s) and tendon(s) of the rotator cuff of right shoulder, initial encounter: Secondary | ICD-10-CM | POA: Diagnosis not present

## 2022-09-11 DIAGNOSIS — S46011D Strain of muscle(s) and tendon(s) of the rotator cuff of right shoulder, subsequent encounter: Secondary | ICD-10-CM | POA: Diagnosis not present

## 2022-10-01 DIAGNOSIS — G8918 Other acute postprocedural pain: Secondary | ICD-10-CM | POA: Diagnosis not present

## 2022-10-01 DIAGNOSIS — Y999 Unspecified external cause status: Secondary | ICD-10-CM | POA: Diagnosis not present

## 2022-10-01 DIAGNOSIS — S46011A Strain of muscle(s) and tendon(s) of the rotator cuff of right shoulder, initial encounter: Secondary | ICD-10-CM | POA: Diagnosis not present

## 2022-10-01 DIAGNOSIS — X58XXXA Exposure to other specified factors, initial encounter: Secondary | ICD-10-CM | POA: Diagnosis not present

## 2022-10-01 DIAGNOSIS — M65811 Other synovitis and tenosynovitis, right shoulder: Secondary | ICD-10-CM | POA: Diagnosis not present

## 2022-10-02 DIAGNOSIS — M25511 Pain in right shoulder: Secondary | ICD-10-CM | POA: Diagnosis not present

## 2022-10-02 DIAGNOSIS — Z4789 Encounter for other orthopedic aftercare: Secondary | ICD-10-CM | POA: Diagnosis not present

## 2022-10-08 DIAGNOSIS — M25511 Pain in right shoulder: Secondary | ICD-10-CM | POA: Diagnosis not present

## 2022-10-08 DIAGNOSIS — Z4789 Encounter for other orthopedic aftercare: Secondary | ICD-10-CM | POA: Diagnosis not present

## 2022-10-11 DIAGNOSIS — Z4789 Encounter for other orthopedic aftercare: Secondary | ICD-10-CM | POA: Diagnosis not present

## 2022-10-11 DIAGNOSIS — M25511 Pain in right shoulder: Secondary | ICD-10-CM | POA: Diagnosis not present

## 2022-10-24 DIAGNOSIS — Z4789 Encounter for other orthopedic aftercare: Secondary | ICD-10-CM | POA: Diagnosis not present

## 2022-10-24 DIAGNOSIS — M25511 Pain in right shoulder: Secondary | ICD-10-CM | POA: Diagnosis not present

## 2022-11-07 DIAGNOSIS — M25511 Pain in right shoulder: Secondary | ICD-10-CM | POA: Diagnosis not present

## 2022-11-07 DIAGNOSIS — Z4789 Encounter for other orthopedic aftercare: Secondary | ICD-10-CM | POA: Diagnosis not present

## 2022-11-21 DIAGNOSIS — M25511 Pain in right shoulder: Secondary | ICD-10-CM | POA: Diagnosis not present

## 2022-11-21 DIAGNOSIS — Z4789 Encounter for other orthopedic aftercare: Secondary | ICD-10-CM | POA: Diagnosis not present

## 2022-12-10 DIAGNOSIS — Z4789 Encounter for other orthopedic aftercare: Secondary | ICD-10-CM | POA: Diagnosis not present

## 2022-12-10 DIAGNOSIS — M25511 Pain in right shoulder: Secondary | ICD-10-CM | POA: Diagnosis not present

## 2023-01-08 ENCOUNTER — Telehealth: Payer: Self-pay | Admitting: *Deleted

## 2023-01-08 NOTE — Telephone Encounter (Signed)
I attempted to contact patient by telephone but was unsuccessful. According to the patient's chart they are due for physical in NOV  with [facility]. I have left a HIPAA compliant message advising the patient to contact [facility] at [phone]. I will continue to follow up with the patient to make sure this appointment is scheduled.

## 2023-01-11 ENCOUNTER — Encounter: Payer: Self-pay | Admitting: *Deleted

## 2023-01-11 NOTE — Telephone Encounter (Signed)
I attempted to contact patient by telephone but was unsuccessful. According to the patient's chart they are due for physical in NOV  with [facility]. I have left a HIPAA compliant message advising the patient to contact [facility] at [phone]. I will continue to follow up with the patient to make sure this appointment is scheduled.

## 2023-01-31 ENCOUNTER — Other Ambulatory Visit: Payer: Self-pay | Admitting: Family Medicine

## 2023-01-31 DIAGNOSIS — M1711 Unilateral primary osteoarthritis, right knee: Secondary | ICD-10-CM | POA: Diagnosis not present

## 2023-01-31 DIAGNOSIS — M11261 Other chondrocalcinosis, right knee: Secondary | ICD-10-CM | POA: Diagnosis not present

## 2023-01-31 DIAGNOSIS — M2391 Unspecified internal derangement of right knee: Secondary | ICD-10-CM | POA: Diagnosis not present

## 2023-01-31 MED ORDER — TADALAFIL 20 MG PO TABS: ORAL_TABLET | ORAL | 5 refills | Status: DC

## 2023-01-31 NOTE — Addendum Note (Signed)
Addended by: Jimmye Norman on: 01/31/2023 11:39 AM   Modules accepted: Orders

## 2023-02-22 DIAGNOSIS — Z0389 Encounter for observation for other suspected diseases and conditions ruled out: Secondary | ICD-10-CM | POA: Diagnosis not present

## 2023-02-22 DIAGNOSIS — I82411 Acute embolism and thrombosis of right femoral vein: Secondary | ICD-10-CM | POA: Diagnosis not present

## 2023-02-22 DIAGNOSIS — X500XXA Overexertion from strenuous movement or load, initial encounter: Secondary | ICD-10-CM | POA: Diagnosis not present

## 2023-02-22 DIAGNOSIS — X58XXXD Exposure to other specified factors, subsequent encounter: Secondary | ICD-10-CM | POA: Diagnosis not present

## 2023-02-22 DIAGNOSIS — M1711 Unilateral primary osteoarthritis, right knee: Secondary | ICD-10-CM | POA: Diagnosis not present

## 2023-02-22 DIAGNOSIS — M25461 Effusion, right knee: Secondary | ICD-10-CM | POA: Diagnosis not present

## 2023-02-22 DIAGNOSIS — R600 Localized edema: Secondary | ICD-10-CM | POA: Diagnosis not present

## 2023-02-22 DIAGNOSIS — S83231D Complex tear of medial meniscus, current injury, right knee, subsequent encounter: Secondary | ICD-10-CM | POA: Diagnosis not present

## 2023-02-22 DIAGNOSIS — S83271A Complex tear of lateral meniscus, current injury, right knee, initial encounter: Secondary | ICD-10-CM | POA: Diagnosis not present

## 2023-03-22 ENCOUNTER — Encounter: Payer: Self-pay | Admitting: Family Medicine

## 2023-03-22 ENCOUNTER — Ambulatory Visit (INDEPENDENT_AMBULATORY_CARE_PROVIDER_SITE_OTHER): Payer: No Typology Code available for payment source | Admitting: Family Medicine

## 2023-03-22 VITALS — BP 120/78 | HR 77 | Temp 97.8°F | Ht 70.0 in | Wt 239.4 lb

## 2023-03-22 DIAGNOSIS — M2550 Pain in unspecified joint: Secondary | ICD-10-CM | POA: Insufficient documentation

## 2023-03-22 LAB — COMPREHENSIVE METABOLIC PANEL
ALT: 27 U/L (ref 0–53)
AST: 20 U/L (ref 0–37)
Albumin: 4.5 g/dL (ref 3.5–5.2)
Alkaline Phosphatase: 83 U/L (ref 39–117)
BUN: 13 mg/dL (ref 6–23)
CO2: 30 meq/L (ref 19–32)
Calcium: 9.8 mg/dL (ref 8.4–10.5)
Chloride: 99 meq/L (ref 96–112)
Creatinine, Ser: 0.84 mg/dL (ref 0.40–1.50)
GFR: 99.13 mL/min (ref >60.00)
Glucose, Bld: 101 mg/dL — ABNORMAL HIGH (ref 70–99)
Potassium: 4.3 meq/L (ref 3.5–5.1)
Sodium: 137 meq/L (ref 135–145)
Total Bilirubin: 0.7 mg/dL (ref 0.2–1.2)
Total Protein: 7.5 g/dL (ref 6.0–8.3)

## 2023-03-22 LAB — CK: Total CK: 119 U/L (ref 7–232)

## 2023-03-22 LAB — CBC
HCT: 43.2 % (ref 39.0–52.0)
Hemoglobin: 14.3 g/dL (ref 13.0–17.0)
MCHC: 33.1 g/dL (ref 30.0–36.0)
MCV: 89.2 fL (ref 78.0–100.0)
Platelets: 307 10*3/uL (ref 150.0–400.0)
RBC: 4.85 Mil/uL (ref 4.22–5.81)
RDW: 12.5 % (ref 11.5–15.5)
WBC: 7.5 10*3/uL (ref 4.0–10.5)

## 2023-03-22 LAB — SEDIMENTATION RATE: Sed Rate: 29 mm/h — ABNORMAL HIGH (ref 0–20)

## 2023-03-22 LAB — URIC ACID: Uric Acid, Serum: 5.6 mg/dL (ref 4.0–7.8)

## 2023-03-22 LAB — TSH: TSH: 1.97 u[IU]/mL (ref 0.35–5.50)

## 2023-03-22 LAB — C-REACTIVE PROTEIN: CRP: 1.3 mg/dL (ref 0.5–20.0)

## 2023-03-22 NOTE — Progress Notes (Signed)
   Kyle [name] is a 52 y.o. male who presents today for an office visit.  Assessment/Plan:  Chronic Problems Addressed Today: Polyarthralgia Broad differential.  It is possible that he could have had a recent exacerbation of underlying osteoarthritis however his degree of pain is atypical for this.  He did have an MRI of his right knee about a month ago that did show some degenerative changes but also potential concern for CPPD.  Given his progressively worsening and severity of symptoms would be reasonable for [place] to complete a autoimmune and rheumatologic workup at this point.  We will check labs including CBC, c-Met, TSH, ANA, rheumatoid factor, HLA-B27, uric acid, sed rate, and CRP.  If labs are negative would have him follow back up with orthopedist for potential evaluation for underlying osteoarthritis versus CPPD.     Subjective:  HPI:  See Assessment / plan for status of chronic conditions. Patient here with generalized body aches for a couple of months. He did have a rotator cuff repair about 6 months ago. Did well with this post operatively. Shortly afterwards started developing right knee pain. He was initially working with his orthopedist for this.  He had up getting an MRI of his right knee which shows some moderate to no changes as well as a cartilage tear.  Also mild Baker's cyst.  Concern for possible CPPD.  His pain is continue to progress.  Sometimes he will except in the middle of night with pain.  Pain is much worse at night and first thing in the morning but then does improve throughout the rest of the day.  He has now developed pain in his left knee though this is still not at the point of severity in his right knee.  Having pain in his left shoulder and bilateral ankles.  Pain goes away with anti-inflammatories. No weakness or numbness. Wife sometimes notices muscle twitching.  No fevers or chills.  No joint swelling.  No erythema.  No rash.       Objective:   Physical Exam: BP 120/78   Pulse 77   Temp 97.8 F (36.6 C) (Temporal)   Ht 5\' 10"  (1.778 m)   Wt 239 lb 6.4 oz (108.6 kg)   SpO2 96%   BMI 34.35 kg/m   Gen: No acute distress, resting comfortably Neuro: Grossly normal, moves all extremities Psych: Normal affect and thought content      Shanita Kanan M. Jimmey Ralph, MD 03/22/2023 2:06 PM

## 2023-03-22 NOTE — Assessment & Plan Note (Signed)
Broad differential.  It is possible that he could have had a recent exacerbation of underlying osteoarthritis however his degree of pain is atypical for this.  He did have an MRI of his right knee about a month ago that did show some degenerative changes but also potential concern for CPPD.  Given his progressively worsening and severity of symptoms would be reasonable for [place] to complete a autoimmune and rheumatologic workup at this point.  We will check labs including CBC, c-Met, TSH, ANA, rheumatoid factor, HLA-B27, uric acid, sed rate, and CRP.  If labs are negative would have him follow back up with orthopedist for potential evaluation for underlying osteoarthritis versus CPPD.

## 2023-03-22 NOTE — Patient Instructions (Signed)
It was very nice to see you today!  We will check blood work today.  You may have osteoarthritis but we will check for other possible causes.  Depending on results we may need to refer you to see the rheumatologist.  Return if symptoms worsen or fail to improve.   Take care, Dr Jimmey Ralph  PLEASE NOTE:  If you had any lab tests, please let us know if you have not heard back within a few days. You may see your results on mychart before we have a chance to review them but we will give you a call once they are reviewed by [place].   If we ordered any referrals today, please let us know if you have not heard from their office within the next week.   If you had any urgent prescriptions sent in today, please check with the pharmacy within an hour of our visit to make sure the prescription was transmitted appropriately.   Please try these tips to maintain a healthy lifestyle:  Eat at least 3 REAL meals and 1-2 snacks per day.  Aim for no more than 5 hours between eating.  If you eat breakfast, please do so within one hour of getting up.   Each meal should contain half fruits/vegetables, one quarter protein, and one quarter carbs (no bigger than a computer mouse)  Cut down on sweet beverages. This includes juice, soda, and sweet tea.   Drink at least 1 glass of water with each meal and aim for at least 8 glasses per day  Exercise at least 150 minutes every week.

## 2023-03-26 LAB — RHEUMATOID FACTOR: Rheumatoid fact SerPl-aCnc: <10 [IU]/mL (ref <14)

## 2023-03-26 LAB — HLA-B27 ANTIGEN: HLA-B27 Antigen: NEGATIVE

## 2023-03-26 LAB — ANA: Anti Nuclear Antibody (ANA): NEGATIVE

## 2023-03-27 ENCOUNTER — Encounter: Payer: Self-pay | Admitting: Family Medicine

## 2023-03-27 NOTE — Telephone Encounter (Signed)
Please advise 

## 2023-03-27 NOTE — Telephone Encounter (Signed)
Please see result note.  Kyle [name]. Jimmey Ralph, MD 03/27/2023 9:15 AM

## 2023-03-27 NOTE — Progress Notes (Signed)
His labs showed mildly increased inflammation but they were negative for everything else.  As we discussed at his office visit he may have plain osteoarthritis or CPPD.  Recommend he follow back up with orthopedics for further management evaluation.

## 2023-04-11 DIAGNOSIS — M25512 Pain in left shoulder: Secondary | ICD-10-CM | POA: Diagnosis not present

## 2023-04-11 DIAGNOSIS — M75122 Complete rotator cuff tear or rupture of left shoulder, not specified as traumatic: Secondary | ICD-10-CM | POA: Diagnosis not present

## 2023-04-11 DIAGNOSIS — M2550 Pain in unspecified joint: Secondary | ICD-10-CM | POA: Diagnosis not present

## 2023-04-22 IMAGING — CT CT CARDIAC CORONARY ARTERY CALCIUM SCORE
3 series · 14 of 20 positions shown, 15 images · non-contrast
Comparison: None.
COMPARISON: None.

Addendum:
EXAM:
OVER-READ INTERPRETATION  CT CHEST

The following report is an over-read performed by radiologist Dr.
Biscuits Bajracharya [REDACTED] on 12/06/2020. This
over-read does not include interpretation of cardiac or coronary
anatomy or pathology. The coronary calcium score interpretation by
the cardiologist is attached.
CLINICAL DATA: Cardiovascular Disease Risk stratification
Coronary Calcium Score
TECHNIQUE: A gated, non-contrast computed tomography scan of the heart was
performed using 3mm slice thickness. Axial images were analyzed on a
dedicated workstation. Calcium scoring of the coronary arteries was
performed using the Agatston method.

[Series 2: casc 3.0 bv41 2 bestdiast 72 % · axial · 0.46mm/px · z∈[+1292,+1368]mm · 4 of 43 slices shown, 5 images]
[im 9/43  vessel]
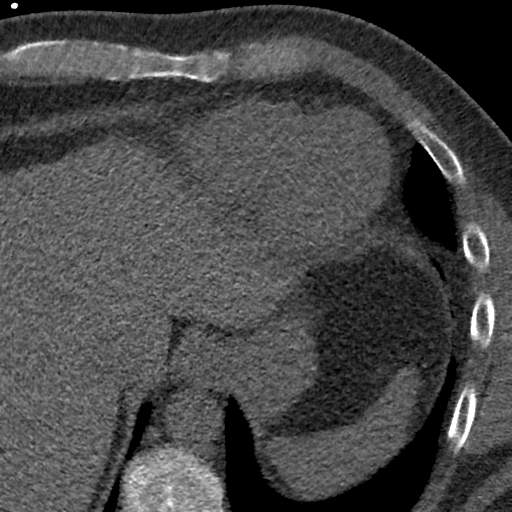
[im 9/43  lung]
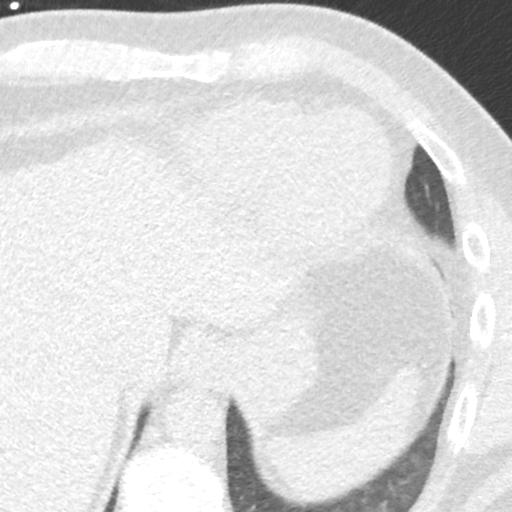
[im 17/43  vessel]
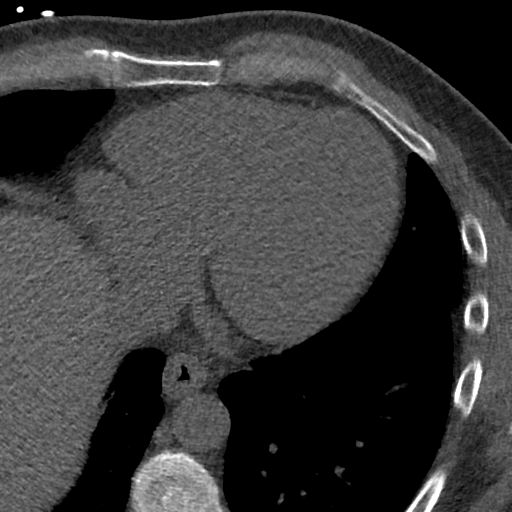
[im 26/43  vessel]
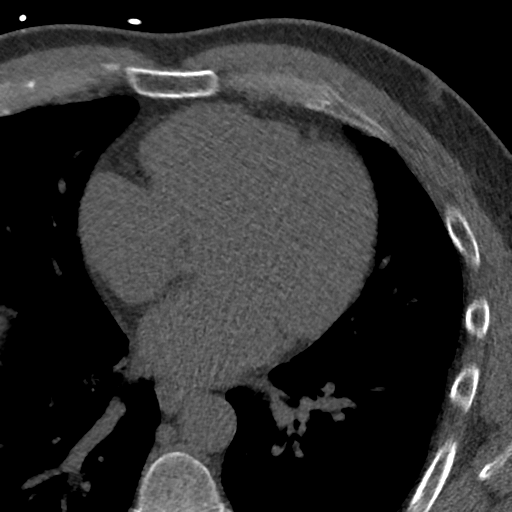
[im 34/43  vessel]
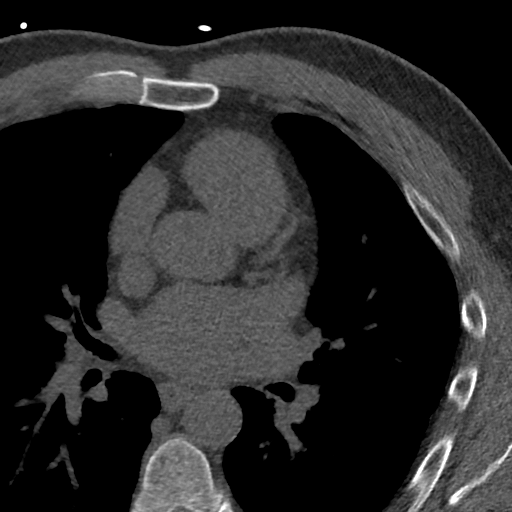

[Series 3: lung 72 % · axial · 0.77mm/px · z∈[+1290,+1374]mm · 5 of 43 slices shown]
[im 8/43  lung]
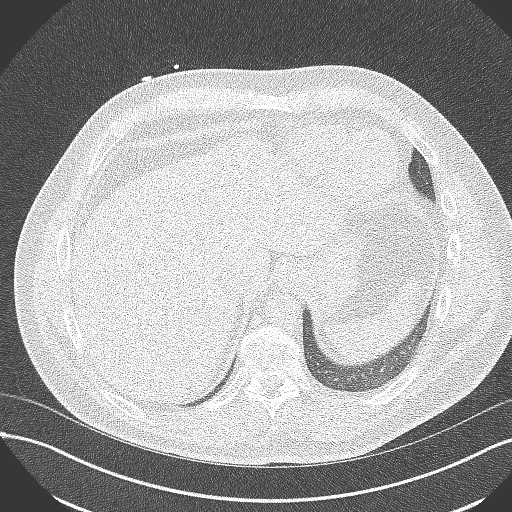
[im 15/43  lung]
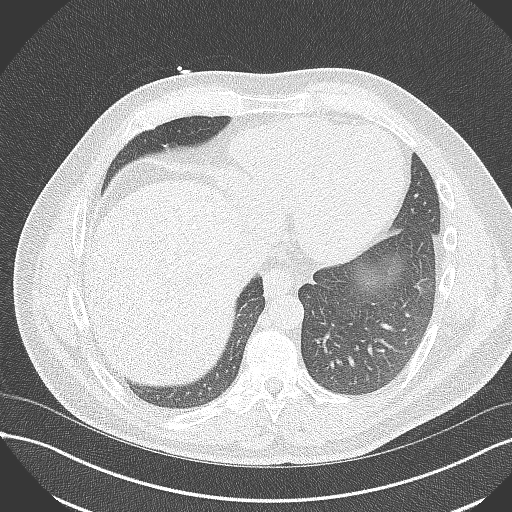
[im 22/43  lung]
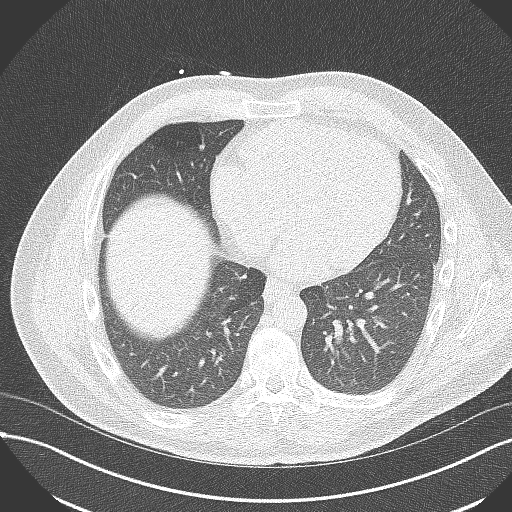
[im 29/43  lung]
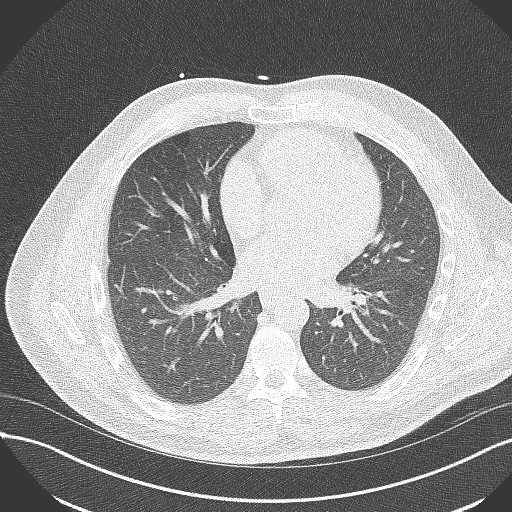
[im 36/43  lung]
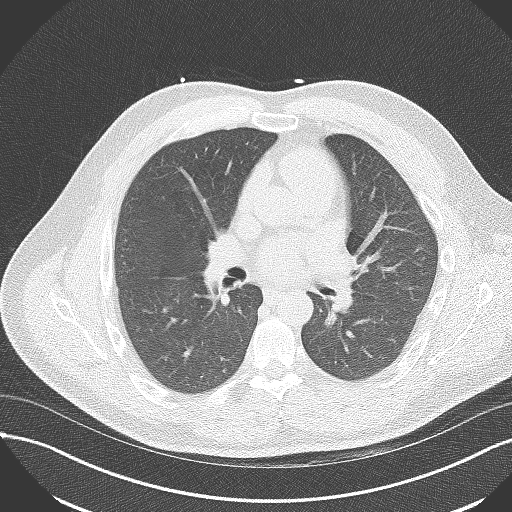

[Series 4: lung st 72 % · axial · 0.77mm/px · z∈[+1290,+1374]mm · 5 of 43 slices shown]
[im 8/43  lung]
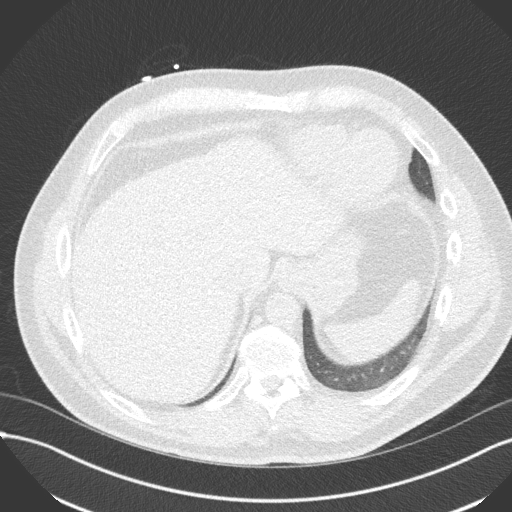
[im 15/43  lung]
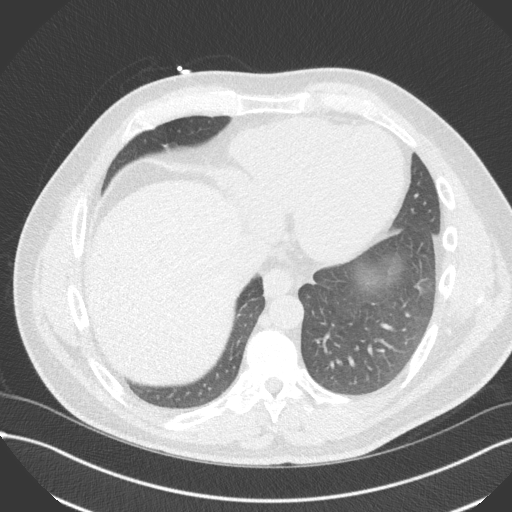
[im 22/43  lung]
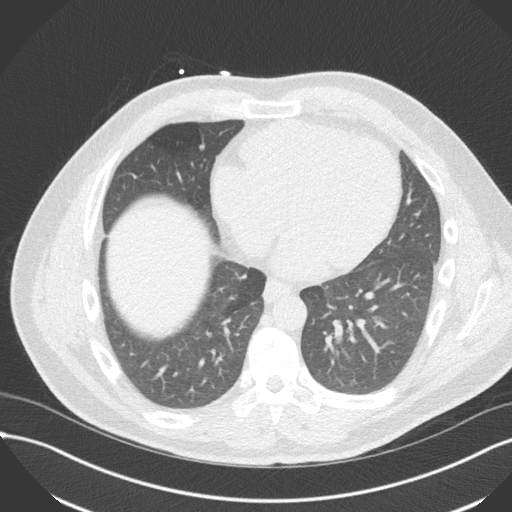
[im 29/43  lung]
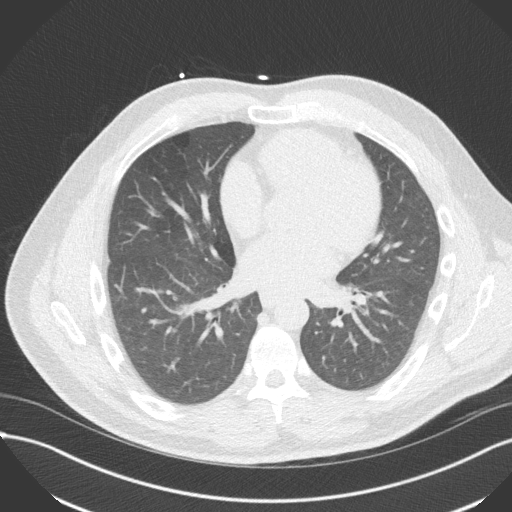
[im 36/43  lung]
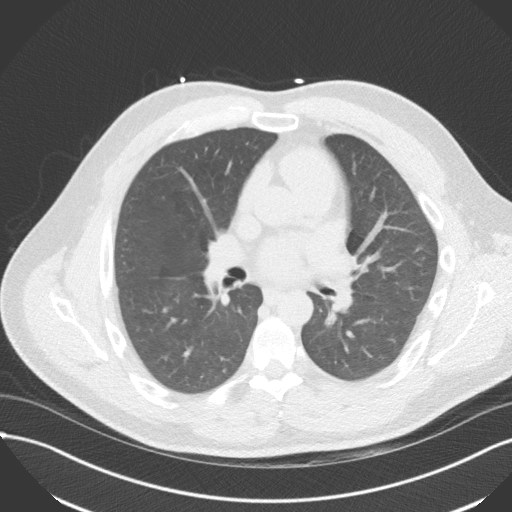

[14 of 20 positions shown; findings below may reference images not displayed]

FINDINGS: Within the visualized portions of the thorax there are no suspicious
appearing pulmonary nodules or masses, there is no acute
consolidative airspace disease, no pleural effusions, no
pneumothorax and no lymphadenopathy. Visualized portions of the
upper abdomen are unremarkable. There are no aggressive appearing
lytic or blastic lesions noted in the visualized portions of the
skeleton.
IMPRESSION: 1. No significant incidental noncardiac findings are noted.
FINDINGS: Coronary arteries: Normal origins.

Coronary Calcium Score:

Left main: 0

Left anterior descending artery: 0

Left circumflex artery: 0

Right coronary artery: 0

Total: 0

Percentile: 0

Pericardium: Normal.

Ascending Aorta: Normal caliber.

Non-cardiac: See separate report from [REDACTED].
IMPRESSION: Coronary calcium score of 0. This is a low risk study.



If CAC=0, it is reasonable to withhold statin therapy and reassess
in 5 to 10 years, as long as higher risk conditions are absent
(diabetes mellitus, family history of premature CHD in first degree
relatives (males <55 years; females <65 years), cigarette smoking,
or LDL >=190 mg/dL).

If CAC is 1 to 99, it is reasonable to initiate statin therapy for
patients >=55 years of age.

If CAC is >=100 or >=75th percentile, it is reasonable to initiate
statin therapy at any age.

Cardiology referral should be considered for patients with CAC
scores >=400 or >=75th percentile.

*4298 AHA/ACC/AACVPR/AAPA/ABC/MENSAGEIRA/EWALDT/QUIRIJN/Mrtnz/STEGALL/MEDALY/NILSSON
Guideline on the Management of Blood Cholesterol: A Report of the
American College of Cardiology/American Heart Association Task Force
on Clinical Practice Guidelines. J Am Coll Cardiol.
4066;73(24):8588-8583.

*** End of Addendum ***
EXAM:
OVER-READ INTERPRETATION  CT CHEST

The following report is an over-read performed by radiologist Dr.
Biscuits Bajracharya [REDACTED] on 12/06/2020. This
over-read does not include interpretation of cardiac or coronary
anatomy or pathology. The coronary calcium score interpretation by
the cardiologist is attached.
FINDINGS: Within the visualized portions of the thorax there are no suspicious
appearing pulmonary nodules or masses, there is no acute
consolidative airspace disease, no pleural effusions, no
pneumothorax and no lymphadenopathy. Visualized portions of the
upper abdomen are unremarkable. There are no aggressive appearing
lytic or blastic lesions noted in the visualized portions of the
skeleton.
IMPRESSION: 1. No significant incidental noncardiac findings are noted.

## 2023-04-24 DIAGNOSIS — M75122 Complete rotator cuff tear or rupture of left shoulder, not specified as traumatic: Secondary | ICD-10-CM | POA: Diagnosis not present

## 2023-04-24 DIAGNOSIS — M75112 Incomplete rotator cuff tear or rupture of left shoulder, not specified as traumatic: Secondary | ICD-10-CM | POA: Diagnosis not present

## 2023-04-24 DIAGNOSIS — M67814 Other specified disorders of tendon, left shoulder: Secondary | ICD-10-CM | POA: Diagnosis not present

## 2023-04-24 DIAGNOSIS — M13812 Other specified arthritis, left shoulder: Secondary | ICD-10-CM | POA: Diagnosis not present

## 2023-04-25 DIAGNOSIS — M7582 Other shoulder lesions, left shoulder: Secondary | ICD-10-CM | POA: Diagnosis not present

## 2023-04-25 DIAGNOSIS — M659 Unspecified synovitis and tenosynovitis, unspecified site: Secondary | ICD-10-CM | POA: Diagnosis not present

## 2023-05-14 ENCOUNTER — Encounter: Payer: Self-pay | Admitting: Family Medicine

## 2023-05-15 MED ORDER — TADALAFIL 20 MG PO TABS: 20.0000 mg | ORAL_TABLET | Freq: Every day | ORAL | 5 refills | Status: DC | PRN

## 2023-05-22 ENCOUNTER — Encounter: Payer: Self-pay | Admitting: Family Medicine

## 2023-05-22 NOTE — Telephone Encounter (Signed)
Please see pt message and advise 

## 2023-05-23 NOTE — Telephone Encounter (Signed)
Yes we can check his thryoid levels at his upcoming physical.  Katina Degree. Jimmey Ralph, MD 05/23/2023 1:02 PM

## 2023-05-31 ENCOUNTER — Ambulatory Visit (INDEPENDENT_AMBULATORY_CARE_PROVIDER_SITE_OTHER): Payer: No Typology Code available for payment source | Admitting: Family Medicine

## 2023-05-31 VITALS — BP 126/76 | HR 75 | Temp 97.8°F | Ht 70.0 in | Wt 235.4 lb

## 2023-05-31 DIAGNOSIS — Z23 Encounter for immunization: Secondary | ICD-10-CM | POA: Diagnosis not present

## 2023-05-31 DIAGNOSIS — E785 Hyperlipidemia, unspecified: Secondary | ICD-10-CM

## 2023-05-31 DIAGNOSIS — Z1159 Encounter for screening for other viral diseases: Secondary | ICD-10-CM

## 2023-05-31 DIAGNOSIS — N529 Male erectile dysfunction, unspecified: Secondary | ICD-10-CM

## 2023-05-31 DIAGNOSIS — M2550 Pain in unspecified joint: Secondary | ICD-10-CM

## 2023-05-31 DIAGNOSIS — F439 Reaction to severe stress, unspecified: Secondary | ICD-10-CM | POA: Insufficient documentation

## 2023-05-31 DIAGNOSIS — L578 Other skin changes due to chronic exposure to nonionizing radiation: Secondary | ICD-10-CM

## 2023-05-31 DIAGNOSIS — Z859 Personal history of malignant neoplasm, unspecified: Secondary | ICD-10-CM | POA: Diagnosis not present

## 2023-05-31 DIAGNOSIS — R739 Hyperglycemia, unspecified: Secondary | ICD-10-CM | POA: Diagnosis not present

## 2023-05-31 DIAGNOSIS — Z125 Encounter for screening for malignant neoplasm of prostate: Secondary | ICD-10-CM | POA: Diagnosis not present

## 2023-05-31 DIAGNOSIS — Z0000 Encounter for general adult medical examination without abnormal findings: Secondary | ICD-10-CM

## 2023-05-31 NOTE — Assessment & Plan Note (Signed)
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  Overall things are manageable.  He will let us know if he needs any further assistance.

## 2023-05-31 NOTE — Patient Instructions (Signed)
It was very nice to see you today!  We will check blood work today.  Will get your flu shot.  Please continue to work on diet and exercise.  Return in about 1 year (around 05/30/2024) for Annual Physical.   Take care, Dr Jimmey Ralph  PLEASE NOTE:  If you had any lab tests, please let us know if you have not heard back within a few days. You may see your results on mychart before we have a chance to review them but we will give you a call once they are reviewed by [place].   If we ordered any referrals today, please let us know if you have not heard from their office within the next week.   If you had any urgent prescriptions sent in today, please check with the pharmacy within an hour of our visit to make sure the prescription was transmitted appropriately.   Please try these tips to maintain a healthy lifestyle:  Eat at least 3 REAL meals and 1-2 snacks per day.  Aim for no more than 5 hours between eating.  If you eat breakfast, please do so within one hour of getting up.   Each meal should contain half fruits/vegetables, one quarter protein, and one quarter carbs (no bigger than a computer mouse)  Cut down on sweet beverages. This includes juice, soda, and sweet tea.   Drink at least 1 glass of water with each meal and aim for at least 8 glasses per day  Exercise at least 150 minutes every week.    Preventive Care 14-32 Years Old, Male Preventive care refers to lifestyle choices and visits with your health care provider that can promote health and wellness. Preventive care visits are also called wellness exams. What can I expect for my preventive care visit? Counseling During your preventive care visit, your health care provider may ask about your: Medical history, including: Past medical problems. Family medical history. Current health, including: Emotional well-being. Home life and relationship well-being. Sexual activity. Lifestyle, including: Alcohol, nicotine or tobacco, and  drug use. Access to firearms. Diet, exercise, and sleep habits. Safety issues such as seatbelt and bike helmet use. Sunscreen use. Work and work Astronomer. Physical exam Your health care provider will check your: Height and weight. These may be used to calculate your BMI (body mass index). BMI is a measurement that tells if you are at a healthy weight. Waist circumference. This measures the distance around your waistline. This measurement also tells if you are at a healthy weight and may help predict your risk of certain diseases, such as type 2 diabetes and high blood pressure. Heart rate and blood pressure. Body temperature. Skin for abnormal spots. What immunizations do I need?  Vaccines are usually given at various ages, according to a schedule. Your health care provider will recommend vaccines for you based on your age, medical history, and lifestyle or other factors, such as travel or where you work. What tests do I need? Screening Your health care provider may recommend screening tests for certain conditions. This may include: Lipid and cholesterol levels. Diabetes screening. This is done by checking your blood sugar (glucose) after you have not eaten for a while (fasting). Hepatitis B test. Hepatitis C test. HIV (human immunodeficiency virus) test. STI (sexually transmitted infection) testing, if you are at risk. Lung cancer screening. Prostate cancer screening. Colorectal cancer screening. Talk with your health care provider about your test results, treatment options, and if necessary, the need for more tests. Follow  these instructions at home: Eating and drinking  Eat a diet that includes fresh fruits and vegetables, whole grains, lean protein, and low-fat dairy products. Take vitamin and mineral supplements as recommended by your health care provider. Do not drink alcohol if your health care provider tells you not to drink. If you drink alcohol: Limit how much you  have to 0-2 drinks a day. Know how much alcohol is in your drink. In the [place] one drink equals one 12 oz bottle of beer (355 mL), one 5 oz glass of wine (148 mL), or one 1 oz glass of hard liquor (44 mL). Lifestyle Brush your teeth every morning and night with fluoride toothpaste. Floss one time each day. Exercise for at least 30 minutes 5 or more days each week. Do not use any products that contain nicotine or tobacco. These products include cigarettes, chewing tobacco, and vaping devices, such as e-cigarettes. If you need help quitting, ask your health care provider. Do not use drugs. If you are sexually active, practice safe sex. Use a condom or other form of protection to prevent STIs. Take aspirin only as told by your health care provider. Make sure that you understand how much to take and what form to take. Work with your health care provider to find out whether it is safe and beneficial for you to take aspirin daily. Find healthy ways to manage stress, such as: Meditation, yoga, or listening to music. Journaling. Talking to a trusted person. Spending time with friends and family. Minimize exposure to UV radiation to reduce your risk of skin cancer. Safety Always wear your seat belt while driving or riding in a vehicle. Do not drive: If you have been drinking alcohol. Do not ride with someone who has been drinking. When you are tired or distracted. While texting. If you have been using any mind-altering substances or drugs. Wear a helmet and other protective equipment during sports activities. If you have firearms in your house, make sure you follow all gun safety procedures. What's next? Go to your health care provider once a year for an annual wellness visit. Ask your health care provider how often you should have your eyes and teeth checked. Stay up to date on all vaccines. This information is not intended to replace advice given to you by your health care provider. Make sure  you discuss any questions you have with your health care provider. Document Revised: 11/30/2020 Document Reviewed: 11/30/2020 Elsevier Patient Education  2024 [employer].

## 2023-05-31 NOTE — Assessment & Plan Note (Signed)
Stable on tadalafil 20mg daily as needed.  

## 2023-05-31 NOTE — Assessment & Plan Note (Signed)
Following with sports medicine and will be seeing rheumatology soon.  His inflammatory workup was largely negative.  Symptoms are improving with lifestyle changes.

## 2023-05-31 NOTE — Assessment & Plan Note (Signed)
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  Will check thyroid labs per patient request.

## 2023-05-31 NOTE — Progress Notes (Signed)
Chief Complaint:  Kyle [name] is a 52 y.o. male who presents today for his annual comprehensive physical exam.    Assessment/Plan:  Chronic Problems Addressed Today: Dyslipidemia Discussed lifestyle modifications.  Check lipids.  Hyperglycemia Check A1c.  Erectile dysfunction Stable on tadalafil 20 mg daily as needed.  Polyarthralgia Following with sports medicine and will be seeing rheumatology soon.  His inflammatory workup was largely negative.  Symptoms are improving with lifestyle changes.  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  Overall things are manageable.  He will let us know if he needs any further assistance.  History of tonsillar cancer Follows with oncology at [facility].  In remission.  Will check thyroid labs per patient request.  Preventative Healthcare: Flu vaccine given today.  Up-to-date on colon cancer screening.  Check labs.  Will refer to dermatology for routine skin cancer surveillance.  Patient Counseling(The following topics were reviewed and/or handout was given):  -Nutrition: Stressed importance of moderation in sodium/caffeine intake, saturated fat and cholesterol, caloric balance, sufficient intake of fresh fruits, vegetables, and fiber.  -Stressed the importance of regular exercise.   -Substance Abuse: Discussed cessation/primary prevention of tobacco, alcohol, or other drug use; driving or other dangerous activities under the influence; availability of treatment for abuse.   -Injury prevention: Discussed safety belts, safety helmets, smoke detector, smoking near bedding or upholstery.   -Sexuality: Discussed sexually transmitted diseases, partner selection, use of condoms, avoidance of unintended pregnancy and contraceptive alternatives.   -Dental health: Discussed importance of regular tooth brushing, flossing, and dental visits.  -Health maintenance and immunizations reviewed.  Please refer to Health maintenance section.  Return to care in 1 year for next preventative visit.     Subjective:  HPI:  He has no acute complaints today. Patient is here today for annual physical. We last saw him a couple of months ago.  Main concern at that time was polyarthralgia.  We checked inflammatory workup which was overall negative.  Since her last visit he has followed with sports medicine for 2 visits.  They obtained an MRI which does show partial tear of rotator cuff and biceps. They were also concerned  about inflammation and he will be seeing rheumatology in a few months. He has been working on making some changes to his stress management and working on Ambulance person including exercise and dietary changes.   He has been under more stress recently which he does think is contributing.  He is primary caregiver for his mother which is adding to his stress.  Currently feels like he has a good strategy in place for this.  Thanks he is managing this well.  Lifestyle Diet: Balanced. Plenty of fruits and vegetables.  Exercise: Exercises routinely.      03/22/2023    1:39 PM  Depression screen PHQ 2/9  Decreased Interest 0  Down, Depressed, Hopeless 0  PHQ - 2 Score 0  Altered sleeping 1  Tired, decreased energy 2  Change in appetite 1  Feeling bad or failure about yourself  0  Trouble concentrating 0  Moving slowly or fidgety/restless 2  Suicidal thoughts 0  PHQ-9 Score 6  Difficult doing work/chores Somewhat difficult    There are no preventive care reminders to display for this patient.    ROS: Per HPI, otherwise a complete review of systems was negative.   PMH:  The following were reviewed and entered/updated in epic:  Past Medical History:  Diagnosis Date   GERD (gastroesophageal reflux disease)    Primary tonsillar squamous cell carcinoma (HCC) 2015   Patient Active Problem List   Diagnosis Date Noted   Stress 05/31/2023   Polyarthralgia 03/22/2023    Erectile dysfunction 05/09/2022   Dyslipidemia 10/27/2020   Hyperglycemia 10/27/2020   GERD (gastroesophageal reflux disease) 10/27/2020   History of tonsillar cancer 07/11/2015   Allergic rhinitis 03/01/2014   Past Surgical History:  Procedure Laterality Date   ANTERIOR CRUCIATE LIGAMENT REPAIR Left 1990   ORIF PATELLA FRACTURE  1986    Family History  Problem Relation Age of Onset   Hypothyroidism Mother    Coronary artery disease Father        status post CABG, carotid endarterectomy, ablation for atrial fib   Diabetes Father    Atrial fibrillation Father    Alcohol abuse Other    Arthritis Other    Diabetes Other    Hyperlipidemia Other    Hypertension Other    Stroke Other    Sudden death Other    Heart disease Other    Colon cancer Neg Hx    Esophageal cancer Neg Hx    Stomach cancer Neg Hx    Rectal cancer Neg Hx     Medications- reviewed and updated Current Outpatient Medications  Medication Sig Dispense Refill   Multiple Vitamin (MULTI-VITAMINS) TABS Take 1 tablet by mouth daily.      tadalafil (CIALIS) 20 MG tablet Take 1 tablet (20 mg total) by mouth daily as needed for erectile dysfunction. 30 tablet 5   azelastine (ASTELIN) 0.1 % nasal spray Place 2 sprays into both nostrils 2 (two) times daily. (Patient not taking: Reported on 05/31/2023) 30 mL 12   No current facility-administered medications for this visit.    Allergies-reviewed and updated No Known Allergies  Social History   Socioeconomic History   Marital status: Married    Spouse name: Not on file   Number of children: Not on file   Years of education: Not on file   Highest education level: Not on file  Occupational History    Employer: [employer]  Tobacco Use   Smoking status: Never   Smokeless tobacco: Never  Vaping Use   Vaping status: Never Used  Substance and Sexual Activity   Alcohol use: Yes    Alcohol/week: 3.0 standard drinks of alcohol    Types: 3 drink(s) per  week   Drug use: No   Sexual activity: Not on file  Other Topics Concern   Not on file  Social History Narrative   Not on file   Social Drivers of Health   Financial Resource Strain: Low Risk  (04/11/2023)   Received from [facility]   Overall Financial Resource Strain (CARDIA)    Difficulty of Paying Living Expenses: Not hard at all  Food Insecurity: No Food Insecurity (04/11/2023)   Received from [facility]   Hunger Vital Sign    Worried About Running Out of Food in the Last Year: Never true    Ran Out of Food in the Last Year: Never true  Transportation Needs: No Transportation Needs (04/11/2023)   Received from [facility] - Transportation    In the past 12 months, has lack of transportation kept you from medical appointments or from getting medications?: No    Lack of Transportation (Non-Medical): No  Physical Activity: Not on file  Stress:  Not on file  Social Connections: Not on file        Objective:  Physical Exam: BP 126/76 (BP Location: Left Arm, Patient Position: Sitting)   Pulse 75   Temp 97.8 F (36.6 C) (Temporal)   Ht 5\' 10"  (1.778 m)   Wt 235 lb 6.4 oz (106.8 kg)   SpO2 96%   BMI 33.78 kg/m   Body mass index is 33.78 kg/m. Wt Readings from Last 3 Encounters:  05/31/23 235 lb 6.4 oz (106.8 kg)  03/22/23 239 lb 6.4 oz (108.6 kg)  05/09/22 242 lb 9.6 oz (110 kg)   Gen: NAD, resting comfortably HEENT: TMs normal bilaterally. OP clear. No thyromegaly noted.  CV: RRR with no murmurs appreciated Pulm: NWOB, CTAB with no crackles, wheezes, or rhonchi GI: Normal bowel sounds present. Soft, Nontender, Nondistended. MSK: no edema, cyanosis, or clubbing noted Skin: warm, dry Neuro: CN2-12 grossly intact. Strength 5/5 in upper and lower extremities. Reflexes symmetric and intact bilaterally.  Psych: Normal affect and thought content     Angeliki Mates M. Jimmey Ralph, MD 05/31/2023 9:00 AM

## 2023-05-31 NOTE — Assessment & Plan Note (Signed)
Check A1c. 

## 2023-05-31 NOTE — Assessment & Plan Note (Signed)
Discussed lifestyle modifications. Check lipids.  ?

## 2023-06-06 ENCOUNTER — Other Ambulatory Visit: Payer: No Typology Code available for payment source

## 2023-06-06 DIAGNOSIS — Z0000 Encounter for general adult medical examination without abnormal findings: Secondary | ICD-10-CM | POA: Diagnosis not present

## 2023-06-06 DIAGNOSIS — Z1159 Encounter for screening for other viral diseases: Secondary | ICD-10-CM | POA: Diagnosis not present

## 2023-06-06 LAB — URINALYSIS, ROUTINE W REFLEX MICROSCOPIC
Bilirubin Urine: NEGATIVE
Hgb urine dipstick: NEGATIVE
Ketones, ur: NEGATIVE
Leukocytes,Ua: NEGATIVE
Nitrite: NEGATIVE
RBC / HPF: NONE SEEN (ref 0–?)
Specific Gravity, Urine: 1.025 (ref 1.000–1.030)
Total Protein, Urine: NEGATIVE
Urine Glucose: NEGATIVE
Urobilinogen, UA: 0.2 (ref 0.0–1.0)
WBC, UA: NONE SEEN (ref 0–?)
pH: 6.5 (ref 5.0–8.0)

## 2023-06-06 LAB — PSA: PSA: 0.55 ng/mL (ref 0.10–4.00)

## 2023-06-06 LAB — LIPID PANEL
Cholesterol: 190 mg/dL (ref 0–200)
HDL: 53.6 mg/dL (ref >39.00)
LDL Cholesterol: 123 mg/dL — ABNORMAL HIGH (ref 0–99)
NonHDL: 136.19
Total CHOL/HDL Ratio: 4
Triglycerides: 67 mg/dL (ref 0.0–149.0)
VLDL: 13.4 mg/dL (ref 0.0–40.0)

## 2023-06-06 LAB — T4, FREE: Free T4: 0.78 ng/dL (ref 0.60–1.60)

## 2023-06-06 LAB — TSH: TSH: 2.31 u[IU]/mL (ref 0.35–5.50)

## 2023-06-06 LAB — T3, FREE: T3, Free: 4.3 pg/mL — ABNORMAL HIGH (ref 2.3–4.2)

## 2023-06-06 LAB — HEMOGLOBIN A1C: Hgb A1c MFr Bld: 5.9 % (ref 4.6–6.5)

## 2023-06-07 LAB — HEPATITIS C ANTIBODY: Hepatitis C Ab: NONREACTIVE

## 2023-06-07 NOTE — Progress Notes (Signed)
Cholesterol still borderline elevated but better than last year.  The rest of his labs are all stable.  Do not need to make any changes to his treatment plan at this time.  He should continue to work on diet and exercise and we can recheck in a year.

## 2023-06-13 NOTE — Telephone Encounter (Signed)
See note

## 2023-06-14 ENCOUNTER — Encounter: Payer: No Typology Code available for payment source | Admitting: Family Medicine

## 2023-06-17 NOTE — Telephone Encounter (Signed)
Recommend he discuss with the pharmacy. We sent in for 30 pills with 5 refills.   Katina Degree. Jimmey Ralph, MD 06/17/2023 7:25 AM

## 2023-06-26 ENCOUNTER — Other Ambulatory Visit: Payer: Self-pay | Admitting: *Deleted

## 2023-06-26 MED ORDER — TADALAFIL 20 MG PO TABS: 20.0000 mg | ORAL_TABLET | Freq: Every day | ORAL | 5 refills | Status: DC | PRN

## 2024-04-28 NOTE — Progress Notes (Signed)
    Chief Complaint: No chief complaint on file.   History of Present Illness:  Kyle [name] is a 53 y.o. male who is seen in consultation from Kennyth Worth HERO, MD for evaluation of ***.   Past Medical History:  Past Medical History:  Diagnosis Date   GERD (gastroesophageal reflux disease)    Primary tonsillar squamous cell carcinoma (HCC) 2015    Past Surgical History:  Past Surgical History:  Procedure Laterality Date   ANTERIOR CRUCIATE LIGAMENT REPAIR Left 1990   ORIF PATELLA FRACTURE  1986    Allergies:  No Known Allergies  Family History:  Family History  Problem Relation Age of Onset   Hypothyroidism Mother    Coronary artery disease Father        status post CABG, carotid endarterectomy, ablation for atrial fib   Diabetes Father    Atrial fibrillation Father    Alcohol abuse Other    Arthritis Other    Diabetes Other    Hyperlipidemia Other    Hypertension Other    Stroke Other    Sudden death Other    Heart disease Other    Colon cancer Neg Hx    Esophageal cancer Neg Hx    Stomach cancer Neg Hx    Rectal cancer Neg Hx     Social History:  Social History   Tobacco Use   Smoking status: Never   Smokeless tobacco: Never  Vaping Use   Vaping status: Never Used  Substance Use Topics   Alcohol use: Yes    Alcohol/week: 3.0 standard drinks of alcohol    Types: 3 drink(s) per week   Drug use: No    Review of symptoms:  Constitutional:  Negative for unexplained weight loss, night sweats, fever, chills ENT:  Negative for nose bleeds, sinus pain, painful swallowing CV:  Negative for chest pain, shortness of breath, exercise intolerance, palpitations, loss of consciousness Resp:  Negative for cough, wheezing, shortness of breath GI:  Negative for nausea, vomiting, diarrhea, bloody stools GU:  Positives noted in HPI; otherwise negative for gross hematuria, dysuria, urinary incontinence Neuro:  Negative for seizures, poor balance, limb  weakness, slurred speech Psych:  Negative for lack of energy, depression, anxiety Endocrine:  Negative for polydipsia, polyuria, symptoms of hypoglycemia (dizziness, hunger, sweating) Hematologic:  Negative for anemia, purpura, petechia, prolonged or excessive bleeding, use of anticoagulants  Allergic:  Negative for difficulty breathing or choking as a result of exposure to anything; no shellfish allergy; no allergic response (rash/itch) to materials, foods  Physical exam: There were no vitals taken for this visit. GENERAL APPEARANCE:  Well appearing, well developed, well nourished, NAD HEENT: Atraumatic, Normocephalic. NECK: Normal appearance LUNGS: Normal inspiratory and expiratory excursion HEART: Regular Rate ABDOMEN: ***. GU: Phallus normal, no lesions. Scrotal skin normal. Testicles/epididymal structures normal. Meatus normal. Normal anal sphincter tone, prostate ***mL, symmetric, non nodular, non tender. EXTREMITIES: Moves all extremities well.  Without clubbing, cyanosis, or edema. NEUROLOGIC:  Alert and oriented x 3, normal gait, CN II-XII grossly intact.  MENTAL STATUS:  Appropriate. SKIN:  Warm, dry and intact.    Results: No results found for this or any previous visit (from the past 24 hours).  I have reviewed referring/prior physicians notes  I have reviewed urinalysis  I have reviewed PSA results  I have reviewed prior imaging  I have reviewed urine culture results  Assessment: ***   Plan: ***

## 2024-04-29 ENCOUNTER — Ambulatory Visit (INDEPENDENT_AMBULATORY_CARE_PROVIDER_SITE_OTHER): Admitting: Urology

## 2024-04-29 VITALS — BP 146/97 | HR 76 | Ht 72.0 in | Wt 230.0 lb

## 2024-04-29 DIAGNOSIS — R109 Unspecified abdominal pain: Secondary | ICD-10-CM

## 2024-04-29 DIAGNOSIS — N5082 Scrotal pain: Secondary | ICD-10-CM

## 2024-04-29 DIAGNOSIS — N50811 Right testicular pain: Secondary | ICD-10-CM

## 2024-04-29 DIAGNOSIS — R1032 Left lower quadrant pain: Secondary | ICD-10-CM

## 2024-04-29 LAB — URINALYSIS, ROUTINE W REFLEX MICROSCOPIC
Bilirubin, UA: NEGATIVE
Glucose, UA: NEGATIVE
Leukocytes,UA: NEGATIVE
Nitrite, UA: NEGATIVE
Protein,UA: NEGATIVE
RBC, UA: NEGATIVE
Specific Gravity, UA: 1.02 (ref 1.005–1.030)
Urobilinogen, Ur: 0.2 mg/dL (ref 0.2–1.0)
pH, UA: 7 (ref 5.0–7.5)

## 2024-04-29 LAB — MICROSCOPIC EXAMINATION

## 2024-04-29 NOTE — Addendum Note (Signed)
 Addended by: KOLEEN CREE C on: 04/29/2024 02:30 PM   Modules accepted: Orders

## 2024-05-01 NOTE — Addendum Note (Signed)
 Addended by: OBADIAH ROSELEE RAMAN on: 05/01/2024 10:35 AM   Modules accepted: Orders

## 2024-05-07 ENCOUNTER — Inpatient Hospital Stay
Admission: RE | Admit: 2024-05-07 | Discharge: 2024-05-07 | Disposition: A | Source: Ambulatory Visit | Attending: Urology | Admitting: Urology

## 2024-05-07 DIAGNOSIS — R1032 Left lower quadrant pain: Secondary | ICD-10-CM

## 2024-05-07 MED ORDER — IOPAMIDOL (ISOVUE-300) INJECTION 61%
100.0000 mL | Freq: Once | INTRAVENOUS | Status: AC | PRN
  Administered 2024-05-07: 100 mL via INTRAVENOUS

## 2024-05-11 ENCOUNTER — Other Ambulatory Visit

## 2024-05-13 ENCOUNTER — Ambulatory Visit: Payer: Self-pay | Admitting: Urology

## 2024-06-04 ENCOUNTER — Encounter: Payer: No Typology Code available for payment source | Admitting: Family Medicine

## 2024-06-09 ENCOUNTER — Encounter: Payer: Self-pay | Admitting: Family Medicine

## 2024-06-30 ENCOUNTER — Ambulatory Visit (INDEPENDENT_AMBULATORY_CARE_PROVIDER_SITE_OTHER): Payer: Self-pay | Admitting: Family Medicine

## 2024-06-30 ENCOUNTER — Encounter: Payer: Self-pay | Admitting: Family Medicine

## 2024-06-30 VITALS — BP 100/70 | HR 73 | Temp 97.1°F | Ht 72.0 in | Wt 250.2 lb

## 2024-06-30 DIAGNOSIS — Z1159 Encounter for screening for other viral diseases: Secondary | ICD-10-CM | POA: Diagnosis not present

## 2024-06-30 DIAGNOSIS — Z859 Personal history of malignant neoplasm, unspecified: Secondary | ICD-10-CM

## 2024-06-30 DIAGNOSIS — Z789 Other specified health status: Secondary | ICD-10-CM | POA: Diagnosis not present

## 2024-06-30 DIAGNOSIS — M2550 Pain in unspecified joint: Secondary | ICD-10-CM | POA: Diagnosis not present

## 2024-06-30 DIAGNOSIS — N529 Male erectile dysfunction, unspecified: Secondary | ICD-10-CM

## 2024-06-30 DIAGNOSIS — R739 Hyperglycemia, unspecified: Secondary | ICD-10-CM | POA: Diagnosis not present

## 2024-06-30 DIAGNOSIS — Z23 Encounter for immunization: Secondary | ICD-10-CM | POA: Diagnosis not present

## 2024-06-30 DIAGNOSIS — Z125 Encounter for screening for malignant neoplasm of prostate: Secondary | ICD-10-CM

## 2024-06-30 DIAGNOSIS — Z0000 Encounter for general adult medical examination without abnormal findings: Secondary | ICD-10-CM

## 2024-06-30 DIAGNOSIS — E785 Hyperlipidemia, unspecified: Secondary | ICD-10-CM | POA: Diagnosis not present

## 2024-06-30 LAB — URINALYSIS, ROUTINE W REFLEX MICROSCOPIC
Bilirubin Urine: NEGATIVE
Hgb urine dipstick: NEGATIVE
Ketones, ur: NEGATIVE
Leukocytes,Ua: NEGATIVE
Nitrite: NEGATIVE
Specific Gravity, Urine: 1.025 (ref 1.000–1.030)
Total Protein, Urine: NEGATIVE
Urine Glucose: NEGATIVE
Urobilinogen, UA: 0.2 (ref 0.0–1.0)
pH: 6 (ref 5.0–8.0)

## 2024-06-30 LAB — LIPID PANEL
Cholesterol: 207 mg/dL — ABNORMAL HIGH (ref 28–200)
HDL: 47.9 mg/dL
LDL Cholesterol: 134 mg/dL — ABNORMAL HIGH (ref 10–99)
NonHDL: 158.68
Total CHOL/HDL Ratio: 4
Triglycerides: 121 mg/dL (ref 10.0–149.0)
VLDL: 24.2 mg/dL (ref 0.0–40.0)

## 2024-06-30 LAB — PSA: PSA: 0.44 ng/mL (ref 0.10–4.00)

## 2024-06-30 LAB — HEMOGLOBIN A1C: Hgb A1c MFr Bld: 5.7 % (ref 4.6–6.5)

## 2024-06-30 MED ORDER — TADALAFIL 20 MG PO TABS: 20.0000 mg | ORAL_TABLET | Freq: Every day | ORAL | 5 refills | Status: DC | PRN

## 2024-06-30 NOTE — Assessment & Plan Note (Signed)
 Follows with oncology at [facility].  He is in remission.  We will check thyroid  labs yearly though he does have this done yesterday and does not need to be repeated today.

## 2024-06-30 NOTE — Patient Instructions (Addendum)
 It was very nice to see you today!  VISIT SUMMARY: Today, you had a routine follow-up visit where we discussed preventive care, lifestyle changes, and vaccination updates. You received the Prevnar 20 and flu vaccines, and we ordered blood work to determine the need for a hepatitis B vaccine. We also addressed your weight gain, blood pressure, joint inflammation, and erectile dysfunction.  YOUR PLAN: ADULT WELLNESS VISIT: This visit focused on preventive care and lifestyle changes. -You received the Prevnar 20 and flu vaccines. -Blood work was ordered to assess the need for a hepatitis B vaccine. -Weight loss through diet and exercise was encouraged. -Aim for 30 minutes of daily exercise, including resistance training. -Consider Pilates or yoga for joint health.  ERECTILE DYSFUNCTION: Your erectile dysfunction is managed with infrequent use of Cialis . -Your Cialis  prescription was refilled.  POLYARTHRALGIA: Joint inflammation symptoms are attributed to age-related changes and possible hip flexor tension. -Resistance training for joint support was recommended. -Incorporate hip flexor stretching exercises.  HYPERTENSION: Your blood pressure is variable, with a recent reading of 160/unknown. -Focus on lifestyle changes, including weight loss and dietary modifications. -Continue regular blood pressure monitoring.  PERSONAL HISTORY OF THROAT CANCER: A recent follow-up indicates a low risk of recurrence. -Thyroid  function is monitored due to radiation exposure. -Recent thyroid  check results were documented. -Annual thyroid  function monitoring will continue.  No follow-ups on file.   Take care, Dr Kennyth  PLEASE NOTE:  If you had any lab tests, please let us  know if you have not heard back within a few days. You may see your results on mychart before we have a chance to review them but we will give you a call once they are reviewed by us .   If we ordered any referrals today, please let  us  know if you have not heard from their office within the next week.   If you had any urgent prescriptions sent in today, please check with the pharmacy within an hour of our visit to make sure the prescription was transmitted appropriately.   Please try these tips to maintain a healthy lifestyle:  Eat at least 3 REAL meals and 1-2 snacks per day.  Aim for no more than 5 hours between eating.  If you eat breakfast, please do so within one hour of getting up.   Each meal should contain half fruits/vegetables, one quarter protein, and one quarter carbs (no bigger than a computer mouse)  Cut down on sweet beverages. This includes juice, soda, and sweet tea.   Drink at least 1 glass of water with each meal and aim for at least 8 glasses per day  Exercise at least 150 minutes every week.    Preventive Care 56-56 Years Old, Male Preventive care refers to lifestyle choices and visits with your health care provider that can promote health and wellness. Preventive care visits are also called wellness exams. What can I expect for my preventive care visit? Counseling During your preventive care visit, your health care provider may ask about your: Medical history, including: Past medical problems. Family medical history. Current health, including: Emotional well-being. Home life and relationship well-being. Sexual activity. Lifestyle, including: Alcohol, nicotine or tobacco, and drug use. Access to firearms. Diet, exercise, and sleep habits. Safety issues such as seatbelt and bike helmet use. Sunscreen use. Work and work astronomer. Physical exam Your health care provider will check your: Height and weight. These may be used to calculate your BMI (body mass index). BMI is a measurement that  tells if you are at a healthy weight. Waist circumference. This measures the distance around your waistline. This measurement also tells if you are at a healthy weight and may help predict your risk of  certain diseases, such as type 2 diabetes and high blood pressure. Heart rate and blood pressure. Body temperature. Skin for abnormal spots. What immunizations do I need?  Vaccines are usually given at various ages, according to a schedule. Your health care provider will recommend vaccines for you based on your age, medical history, and lifestyle or other factors, such as travel or where you work. What tests do I need? Screening Your health care provider may recommend screening tests for certain conditions. This may include: Lipid and cholesterol levels. Diabetes screening. This is done by checking your blood sugar (glucose) after you have not eaten for a while (fasting). Hepatitis B test. Hepatitis C test. HIV (human immunodeficiency virus) test. STI (sexually transmitted infection) testing, if you are at risk. Lung cancer screening. Prostate cancer screening. Colorectal cancer screening. Talk with your health care provider about your test results, treatment options, and if necessary, the need for more tests. Follow these instructions at home: Eating and drinking  Eat a diet that includes fresh fruits and vegetables, whole grains, lean protein, and low-fat dairy products. Take vitamin and mineral supplements as recommended by your health care provider. Do not drink alcohol if your health care provider tells you not to drink. If you drink alcohol: Limit how much you have to 0-2 drinks a day. Know how much alcohol is in your drink. In the [place] one drink equals one 12 oz bottle of beer (355 mL), one 5 oz glass of wine (148 mL), or one 1 oz glass of hard liquor (44 mL). Lifestyle Brush your teeth every morning and night with fluoride toothpaste. Floss one time each day. Exercise for at least 30 minutes 5 or more days each week. Do not use any products that contain nicotine or tobacco. These products include cigarettes, chewing tobacco, and vaping devices, such as e-cigarettes. If you  need help quitting, ask your health care provider. Do not use drugs. If you are sexually active, practice safe sex. Use a condom or other form of protection to prevent STIs. Take aspirin only as told by your health care provider. Make sure that you understand how much to take and what form to take. Work with your health care provider to find out whether it is safe and beneficial for you to take aspirin daily. Find healthy ways to manage stress, such as: Meditation, yoga, or listening to music. Journaling. Talking to a trusted person. Spending time with friends and family. Minimize exposure to UV radiation to reduce your risk of skin cancer. Safety Always wear your seat belt while driving or riding in a vehicle. Do not drive: If you have been drinking alcohol. Do not ride with someone who has been drinking. When you are tired or distracted. While texting. If you have been using any mind-altering substances or drugs. Wear a helmet and other protective equipment during sports activities. If you have firearms in your house, make sure you follow all gun safety procedures. What's next? Go to your health care provider once a year for an annual wellness visit. Ask your health care provider how often you should have your eyes and teeth checked. Stay up to date on all vaccines. This information is not intended to replace advice given to you by your health care provider. Make sure you  discuss any questions you have with your health care provider. Document Revised: 11/30/2020 Document Reviewed: 11/30/2020 Elsevier Patient Education  2024 [employer].

## 2024-06-30 NOTE — Assessment & Plan Note (Signed)
 Check lipids with labs.  Discussed lifestyle modifications.

## 2024-06-30 NOTE — Assessment & Plan Note (Signed)
 Saw a rheumatology and they did not think his polyarthralgia or loose related to a rheumatologic cause.  More likely to be osteoarthritis.  We discussed home exercises.  Discussed referral to PT however he declined.  He will let us  know if he changes his mind.

## 2024-06-30 NOTE — Progress Notes (Signed)
 "  Chief Complaint:  Kyle [name] is a 54 y.o. male who presents today for his annual comprehensive physical exam.    Assessment/Plan:  Chronic Problems Addressed Today: History of tonsillar cancer Follows with oncology at [facility].  He is in remission.  We will check thyroid  labs yearly though he does have this done yesterday and does not need to be repeated today.  Dyslipidemia Check lipids with labs.  Discussed lifestyle modifications.  Hyperglycemia Check A1C with labs.  Erectile dysfunction Stable on Cialis  20 mg daily as needed.  Polyarthralgia Saw a rheumatology and they did not think his polyarthralgia or loose related to a rheumatologic cause.  More likely to be osteoarthritis.  We discussed home exercises.  Discussed referral to PT however he declined.  He will let us  know if he changes his mind.   Preventative Healthcare: Check labs.  Flu and pneumonia vaccines given today.  Up-to-date on colon cancer screening.  Patient Counseling(The following topics were reviewed and/or handout was given):  -Nutrition: Stressed importance of moderation in sodium/caffeine intake, saturated fat and cholesterol, caloric balance, sufficient intake of fresh fruits, vegetables, and fiber.  -Stressed the importance of regular exercise.   -Substance Abuse: Discussed cessation/primary prevention of tobacco, alcohol, or other drug use; driving or other dangerous activities under the influence; availability of treatment for abuse.   -Injury prevention: Discussed safety belts, safety helmets, smoke detector, smoking near bedding or upholstery.   -Sexuality: Discussed sexually transmitted diseases, partner selection, use of condoms, avoidance of unintended pregnancy and contraceptive alternatives.   -Dental health: Discussed importance of regular tooth brushing, flossing, and dental visits.  -Health maintenance and immunizations reviewed. Please refer to Health maintenance section.  Return to  care in 1 year for next preventative visit.     Subjective:  HPI:  He has no acute complaints today. Patient is here today for his annual physical.  See assessment / plan for status of chronic conditions.  Discussed the use of AI scribe software for clinical note transcription with the patient, who gave verbal consent to proceed.  History of Present Illness Kyle [name] is a 54 year old male who presents for routine follow-up and vaccination updates.  He is considering receiving the pneumonia and flu vaccines today and inquired about the Prevnar 20 vaccine, which covers 20 strains of pneumonia. He is also interested in the hepatitis B vaccine, pending blood work to determine necessity.  He recently had his ten-year checkup at [facility] for throat cancer and was released from further follow-up. He had a thyroid  check yesterday, which was normal, and requests that this be noted in his records due to past radiation treatment.  He has experienced inflammation issues in various joints, which were evaluated by a rheumatologist who ruled out rheumatoid arthritis. He attributes some of his symptoms to past caregiving responsibilities for his mother with ALS, which impacted his self-care and physical activity.  He reports a weight gain of 20-25 pounds since November, correlating with increased blood pressure readings, which he monitors weekly. His blood pressure was 160 at a recent checkup, but he notes it usually follows his weight.  He discusses his sedentary lifestyle, especially during his mother's care, and acknowledges the need for increased physical activity. He is considering Pilates or yoga to improve flexibility and joint health. He has a full gym at home.  He uses Cialis  infrequently and has a prescription for it, noting he still has a half bottle remaining. He requests  a refill to have it available as needed.       06/30/2024    7:53 AM  Depression screen PHQ 2/9   Decreased Interest 0  Down, Depressed, Hopeless 0  PHQ - 2 Score 0    Health Maintenance Due  Topic Date Due   Influenza Vaccine  01/17/2024     ROS: Per HPI, otherwise a complete review of systems was negative.   PMH:  The following were reviewed and entered/updated in epic: Past Medical History:  Diagnosis Date   GERD (gastroesophageal reflux disease)    Primary tonsillar squamous cell carcinoma (HCC) 2015   Patient Active Problem List   Diagnosis Date Noted   Stress 05/31/2023   Polyarthralgia 03/22/2023   Erectile dysfunction 05/09/2022   Dyslipidemia 10/27/2020   Hyperglycemia 10/27/2020   GERD (gastroesophageal reflux disease) 10/27/2020   History of tonsillar cancer 07/11/2015   Allergic rhinitis 03/01/2014   Past Surgical History:  Procedure Laterality Date   ANTERIOR CRUCIATE LIGAMENT REPAIR Left 1990   ORIF PATELLA FRACTURE  1986    Family History  Problem Relation Age of Onset   Hypothyroidism Mother    Coronary artery disease Father        status post CABG, carotid endarterectomy, ablation for atrial fib   Diabetes Father    Atrial fibrillation Father    Alcohol abuse Other    Arthritis Other    Diabetes Other    Hyperlipidemia Other    Hypertension Other    Stroke Other    Sudden death Other    Heart disease Other    Colon cancer Neg Hx    Esophageal cancer Neg Hx    Stomach cancer Neg Hx    Rectal cancer Neg Hx     Medications- reviewed and updated Current Outpatient Medications  Medication Sig Dispense Refill   tadalafil  (CIALIS ) 20 MG tablet Take 1 tablet (20 mg total) by mouth daily as needed for erectile dysfunction. 30 tablet 5   No current facility-administered medications for this visit.    Allergies-reviewed and updated Allergies[1]  Social History   Socioeconomic History   Marital status: Married    Spouse name: Not on file   Number of children: Not on file   Years of education: Not on file   Highest education level:  Not on file  Occupational History    Employer: [employer]  Tobacco Use   Smoking status: Never   Smokeless tobacco: Never  Vaping Use   Vaping status: Never Used  Substance and Sexual Activity   Alcohol use: Yes    Alcohol/week: 3.0 standard drinks of alcohol    Types: 3 drink(s) per week   Drug use: No   Sexual activity: Not on file  Other Topics Concern   Not on file  Social History Narrative   Not on file   Social Drivers of Health   Tobacco Use: Low Risk (06/30/2024)   Patient History    Smoking Tobacco Use: Never    Smokeless Tobacco Use: Never    Passive Exposure: Not on file  Financial Resource Strain: Low Risk  (04/11/2023)   Received from [facility]   Overall Financial Resource Strain (CARDIA)    Difficulty of Paying Living Expenses: Not hard at all  Food Insecurity: No Food Insecurity (04/11/2023)   Received from [facility]   Epic    Within the past 12 months, you worried that your food would run out before  you got the money to buy more.: Never true    Within the past 12 months, the food you bought just didn't last and you didn't have money to get more.: Never true  Transportation Needs: No Transportation Needs (04/11/2023)   Received from [facility] - Transportation    In the past 12 months, has lack of transportation kept you from medical appointments or from getting medications?: No    Lack of Transportation (Non-Medical): No  Physical Activity: Not on file  Stress: Not on file  Social Connections: Not on file  Depression (PHQ2-9): Low Risk (06/30/2024)   Depression (PHQ2-9)    PHQ-2 Score: 0  Alcohol Screen: Not on file  Housing: Unknown (06/29/2024)   Received from [facility]   Epic    In the last 12 months, was there a time when you were not able to pay the mortgage or rent on time?: No    Number of Times Moved in the Last Year: Not on file    At any time in  the past 12 months, were you homeless or living in a shelter (including now)?: No  Utilities: Not At Risk (04/11/2023)   Received from [facility] Utilities    Threatened with loss of utilities: No  Health Literacy: Not on file        Objective:  Physical Exam: BP 100/70   Pulse 73   Temp (!) 97.1 F (36.2 C) (Temporal)   Ht 6' (1.829 m)   Wt 250 lb 3.2 oz (113.5 kg)   SpO2 95%   BMI 33.93 kg/m   Body mass index is 33.93 kg/m. Wt Readings from Last 3 Encounters:  06/30/24 250 lb 3.2 oz (113.5 kg)  04/29/24 230 lb (104.3 kg)  05/31/23 235 lb 6.4 oz (106.8 kg)   Gen: NAD, resting comfortably HEENT: TMs normal bilaterally. OP clear. No thyromegaly noted.  CV: RRR with no murmurs appreciated Pulm: NWOB, CTAB with no crackles, wheezes, or rhonchi GI: Normal bowel sounds present. Soft, Nontender, Nondistended. MSK: no edema, cyanosis, or clubbing noted Skin: warm, dry Neuro: CN2-12 grossly intact. Strength 5/5 in upper and lower extremities. Reflexes symmetric and intact bilaterally.  Psych: Normal affect and thought content     Anthoni Geerts M. Kennyth, MD 06/30/2024 8:25 AM     [1] No Known Allergies  "

## 2024-06-30 NOTE — Assessment & Plan Note (Signed)
Check A1C with labs

## 2024-06-30 NOTE — Assessment & Plan Note (Signed)
Stable on Cialis 20 mg daily as needed.

## 2024-07-07 ENCOUNTER — Ambulatory Visit: Payer: Self-pay | Admitting: Family Medicine

## 2024-07-07 DIAGNOSIS — E785 Hyperlipidemia, unspecified: Secondary | ICD-10-CM

## 2024-07-07 NOTE — Progress Notes (Signed)
 Please check with the lab regarding his hepatitis B labs - we need to check these before he can get the vaccines.   His cholesterol levels are little bit up but about the same as last year.  All of his other labs are at goal.  Do not need to make any changes to his treatment plan.  He should keep up the great work with diet and exercise and we can recheck in a year.

## 2024-07-07 NOTE — Telephone Encounter (Signed)
 Please advise.

## 2024-07-07 NOTE — Telephone Encounter (Signed)
 See result note. We need his other labs back first before we can determine this.

## 2024-07-15 LAB — HEPATITIS B CORE ANTIBODY, TOTAL: Hep B Core Total Ab: NONREACTIVE

## 2024-07-15 NOTE — Progress Notes (Signed)
 Please see previous result note about getting the other tests done that were ordered weeks ago

## 2024-07-17 ENCOUNTER — Other Ambulatory Visit

## 2024-07-17 NOTE — Addendum Note (Signed)
 Addended by: CRISTOPHER TAWNI BROCKS on: 07/17/2024 09:15 AM   Modules accepted: Orders

## 2024-07-18 ENCOUNTER — Other Ambulatory Visit: Payer: Self-pay | Admitting: Family Medicine

## 2024-09-21 ENCOUNTER — Encounter: Admitting: Family Medicine

## 2025-07-06 ENCOUNTER — Encounter: Payer: Self-pay | Admitting: Family Medicine
